# Patient Record
Sex: Female | Born: 1985 | Race: White | Hispanic: No | Marital: Single | Smoking: Former smoker
Health system: Southern US, Community
[De-identification: ages and names within clinical notes are randomized; demographics above are authoritative.]

## PROBLEM LIST (undated history)

## (undated) ENCOUNTER — Inpatient Hospital Stay (HOSPITAL_COMMUNITY): Payer: Self-pay

## (undated) DIAGNOSIS — B999 Unspecified infectious disease: Secondary | ICD-10-CM

## (undated) DIAGNOSIS — O139 Gestational [pregnancy-induced] hypertension without significant proteinuria, unspecified trimester: Secondary | ICD-10-CM

## (undated) DIAGNOSIS — N189 Chronic kidney disease, unspecified: Secondary | ICD-10-CM

## (undated) HISTORY — DX: Unspecified infectious disease: B99.9

## (undated) HISTORY — DX: Chronic kidney disease, unspecified: N18.9

## (undated) HISTORY — PX: WISDOM TOOTH EXTRACTION: SHX21

---
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
## 2004-02-09 ENCOUNTER — Inpatient Hospital Stay (HOSPITAL_COMMUNITY): Admission: AD | Admit: 2004-02-09 | Discharge: 2004-02-10 | Payer: Self-pay | Admitting: Obstetrics and Gynecology

## 2004-03-06 ENCOUNTER — Emergency Department (HOSPITAL_COMMUNITY): Admission: EM | Admit: 2004-03-06 | Discharge: 2004-03-07 | Payer: Self-pay

## 2004-03-11 ENCOUNTER — Emergency Department (HOSPITAL_COMMUNITY): Admission: EM | Admit: 2004-03-11 | Discharge: 2004-03-11 | Payer: Self-pay | Admitting: Family Medicine

## 2004-03-22 ENCOUNTER — Ambulatory Visit: Payer: Self-pay | Admitting: Psychiatry

## 2004-03-22 ENCOUNTER — Inpatient Hospital Stay (HOSPITAL_COMMUNITY): Admission: AD | Admit: 2004-03-22 | Discharge: 2004-03-25 | Payer: Self-pay | Admitting: Psychiatry

## 2004-03-24 ENCOUNTER — Encounter (HOSPITAL_COMMUNITY): Payer: Self-pay | Admitting: Psychiatry

## 2005-02-10 ENCOUNTER — Inpatient Hospital Stay (HOSPITAL_COMMUNITY): Admission: AD | Admit: 2005-02-10 | Discharge: 2005-02-11 | Payer: Self-pay | Admitting: Obstetrics and Gynecology

## 2005-06-10 ENCOUNTER — Ambulatory Visit (HOSPITAL_COMMUNITY): Admission: RE | Admit: 2005-06-10 | Discharge: 2005-06-10 | Payer: Self-pay | Admitting: *Deleted

## 2005-09-28 ENCOUNTER — Inpatient Hospital Stay (HOSPITAL_COMMUNITY): Admission: AD | Admit: 2005-09-28 | Discharge: 2005-10-02 | Payer: Self-pay | Admitting: Obstetrics

## 2005-09-28 ENCOUNTER — Ambulatory Visit: Payer: Self-pay | Admitting: Family Medicine

## 2005-10-03 ENCOUNTER — Inpatient Hospital Stay (HOSPITAL_COMMUNITY): Admission: AD | Admit: 2005-10-03 | Discharge: 2005-10-04 | Payer: Self-pay | Admitting: Obstetrics and Gynecology

## 2005-10-03 ENCOUNTER — Ambulatory Visit: Payer: Self-pay | Admitting: Certified Nurse Midwife

## 2006-03-06 ENCOUNTER — Emergency Department (HOSPITAL_COMMUNITY): Admission: EM | Admit: 2006-03-06 | Discharge: 2006-03-06 | Payer: Self-pay | Admitting: Emergency Medicine

## 2006-12-30 ENCOUNTER — Inpatient Hospital Stay (HOSPITAL_COMMUNITY): Admission: AD | Admit: 2006-12-30 | Discharge: 2006-12-30 | Payer: Self-pay | Admitting: Obstetrics & Gynecology

## 2008-06-10 ENCOUNTER — Emergency Department (HOSPITAL_COMMUNITY): Admission: EM | Admit: 2008-06-10 | Discharge: 2008-06-10 | Payer: Self-pay | Admitting: Family Medicine

## 2008-07-07 ENCOUNTER — Emergency Department (HOSPITAL_COMMUNITY): Admission: EM | Admit: 2008-07-07 | Discharge: 2008-07-07 | Payer: Self-pay | Admitting: Emergency Medicine

## 2008-12-06 ENCOUNTER — Emergency Department (HOSPITAL_COMMUNITY): Admission: EM | Admit: 2008-12-06 | Discharge: 2008-12-06 | Payer: Self-pay | Admitting: Emergency Medicine

## 2009-01-22 ENCOUNTER — Emergency Department (HOSPITAL_COMMUNITY): Admission: EM | Admit: 2009-01-22 | Discharge: 2009-01-22 | Payer: Self-pay | Admitting: Family Medicine

## 2009-02-19 ENCOUNTER — Inpatient Hospital Stay (HOSPITAL_COMMUNITY): Admission: AD | Admit: 2009-02-19 | Discharge: 2009-02-19 | Payer: Self-pay | Admitting: Obstetrics & Gynecology

## 2009-03-21 ENCOUNTER — Emergency Department (HOSPITAL_COMMUNITY): Admission: EM | Admit: 2009-03-21 | Discharge: 2009-03-21 | Payer: Self-pay | Admitting: Emergency Medicine

## 2009-04-26 ENCOUNTER — Inpatient Hospital Stay (HOSPITAL_COMMUNITY): Admission: AD | Admit: 2009-04-26 | Discharge: 2009-04-26 | Payer: Self-pay | Admitting: Obstetrics & Gynecology

## 2009-09-19 ENCOUNTER — Emergency Department (HOSPITAL_COMMUNITY): Admission: EM | Admit: 2009-09-19 | Discharge: 2009-09-20 | Payer: Self-pay | Admitting: Emergency Medicine

## 2009-10-01 ENCOUNTER — Emergency Department (HOSPITAL_COMMUNITY): Admission: EM | Admit: 2009-10-01 | Discharge: 2009-10-01 | Payer: Self-pay | Admitting: Family Medicine

## 2009-11-06 ENCOUNTER — Inpatient Hospital Stay (HOSPITAL_COMMUNITY): Admission: AD | Admit: 2009-11-06 | Discharge: 2009-11-06 | Payer: Self-pay | Admitting: Obstetrics and Gynecology

## 2010-03-23 ENCOUNTER — Emergency Department (HOSPITAL_COMMUNITY): Admission: EM | Admit: 2010-03-23 | Discharge: 2010-03-23 | Payer: Self-pay | Admitting: Family Medicine

## 2010-09-08 LAB — POCT PREGNANCY, URINE: Preg Test, Ur: NEGATIVE

## 2010-09-10 LAB — URINALYSIS, ROUTINE W REFLEX MICROSCOPIC
Glucose, UA: NEGATIVE mg/dL
Ketones, ur: 15 mg/dL — AB
pH: 7.5 (ref 5.0–8.0)

## 2010-09-10 LAB — URINE MICROSCOPIC-ADD ON

## 2010-09-10 LAB — URINE CULTURE

## 2010-09-10 LAB — POCT PREGNANCY, URINE: Preg Test, Ur: NEGATIVE

## 2010-09-10 LAB — POCT RAPID STREP A (OFFICE): Streptococcus, Group A Screen (Direct): POSITIVE — AB

## 2010-09-24 LAB — POCT PREGNANCY, URINE: Preg Test, Ur: NEGATIVE

## 2010-09-27 LAB — POCT PREGNANCY, URINE: Preg Test, Ur: NEGATIVE

## 2010-09-27 LAB — POCT URINALYSIS DIP (DEVICE)
Glucose, UA: NEGATIVE mg/dL
Ketones, ur: NEGATIVE mg/dL
Nitrite: NEGATIVE
Urobilinogen, UA: 0.2 mg/dL (ref 0.0–1.0)

## 2010-09-27 LAB — WET PREP, GENITAL: Trich, Wet Prep: NONE SEEN

## 2010-09-27 LAB — GC/CHLAMYDIA PROBE AMP, GENITAL
Chlamydia, DNA Probe: NEGATIVE
GC Probe Amp, Genital: NEGATIVE
GC Probe Amp, Genital: NEGATIVE

## 2010-09-27 LAB — URINE CULTURE

## 2010-09-29 LAB — POCT URINALYSIS DIP (DEVICE)
Ketones, ur: NEGATIVE mg/dL
Nitrite: NEGATIVE
Specific Gravity, Urine: 1.02 (ref 1.005–1.030)
Urobilinogen, UA: 1 mg/dL (ref 0.0–1.0)

## 2010-09-29 LAB — POCT PREGNANCY, URINE: Preg Test, Ur: NEGATIVE

## 2010-09-29 LAB — GC/CHLAMYDIA PROBE AMP, GENITAL: Chlamydia, DNA Probe: NEGATIVE

## 2010-11-07 NOTE — Discharge Summary (Signed)
NAME:  [name], [name]                 ACCOUNT NO.:  [id number]   MEDICAL RECORD NO.:  [id number]          PATIENT TYPE:  INP   LOCATION:  9112                          FACILITY:  [facility]   PHYSICIAN:  Phil D. Okey Dupre, M.D.     DATE OF BIRTH:  [birth year]   DATE OF ADMISSION:  09/28/2005  DATE OF DISCHARGE:  10/01/2005                                 DISCHARGE SUMMARY   REASON FOR ADMISSION:  Induction of labor secondary to mild preeclampsia.   DISCHARGE DIAGNOSES:  1.  Term vaginal delivery.  2.  Mild preeclampsia  3.  First-degree perineal laceration repair.   HOSPITAL COURSE:  [name] [name] is a 25 year old, G2, P1-0-1-1 who initially  presented at 39+ weeks with mild preeclampsia, systolic blood pressures in  the 140s, diastolic resting 644 who presented to [facility] for vision  changes and had urinalysis that was positive for protein, a creatinine of  0.9, uric acid of 6.7, platelets of 185, AST and ALT 18 and 9 with an LDH of  144.  The patient was placed on magnesium throughout her labor process and  was admitted for induction of labor and was induced using Cytotec.  The  patient was GBS positive and had a penicillin allergy and was given  clindamycin during the delivery.  On September 29, 2005, at 10:01 a.m., this  female delivered a viable female by normal spontaneous vaginal delivery with  Apgar's of 9 and 9 under epidural and had a pudendal block with anesthesia  to repair a first-degree laceration.  Bulb suction was performed at the  perineum.  There were no nuchal cords.  The cord was clamped and cut.  Infant was handed to mom and then to the R.N. team.  Cord blood was sent.  An intact, three-vessel placenta delivered spontaneously at 10:10 a.m.  Cervix and vagina were inspected and repaired as listed above with 3-0  Vicryl.  Estimated blood loss was less than 400 mL.  The patient and the  infant were stable.  Given the patient's history of preeclampsia, she was  transferred to the  intensive care unit to receive magnesium x24 hours after  delivery to prevent seizure activity.  The patient had no seizure activity,  no neurological deficits and her blood pressure prior to discharge was  137/85 with a heart rate of 76.  She did diurese well with the magnesium,  therefore, her lactate Ringer's IV fluid was KVO and magnesium was  discontinued.   DISCHARGE LABORATORY DATA AND X-RAY FINDINGS:  Discharge lab work includes  CBC with hemoglobin of 9.7, white blood cell count of 10, hematocrit of 28,  platelets of 168 with an MCV of 89.4.  Sodium 137, potassium 4, chloride  109, bicarb 26, glucose 98, BUN 10, creatinine 1.  Total bilirubin 0.4, Alk  phos 175, AST 21, ALT 81.  LDH 195.  Uric acid of 6.9.  Magnesium level of  5.8.   DISCHARGE MEDICATIONS:  1.  Ibuprofen 600 mg one tablet every 6 hours as needed for pain.  2.  Prenatal vitamin one tablet  daily while breast-feeding.  3.  Colace 100 mg one tablet p.o. b.i.d. to avoid constipation.  4.  The patient will use Depo-Provera as contraception.   ACTIVITY:  She was instructed to have nothing in the vagina x6 weeks.   FOLLOW UP:  She will follow up at [facility] in 6 weeks.   CONDITION ON DISCHARGE:  She had a female, no circumcision, weight 7 pounds 2  ounces (3250 g), 21 inches.   SPECIAL INSTRUCTIONS:  The patient was instructed to return to the hospital  for fever, high blood pressure, headache or any other concerns.      Alanson Puls, M.D.    ______________________________  Javier Glazier. Okey Dupre, M.D.    MR/MEDQ  D:  10/01/2005  T:  10/02/2005  Job:  [id number]
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
## 2010-11-15 ENCOUNTER — Ambulatory Visit (INDEPENDENT_AMBULATORY_CARE_PROVIDER_SITE_OTHER): Payer: Self-pay

## 2010-11-15 ENCOUNTER — Inpatient Hospital Stay (INDEPENDENT_AMBULATORY_CARE_PROVIDER_SITE_OTHER)
Admission: RE | Admit: 2010-11-15 | Discharge: 2010-11-15 | Disposition: A | Discharge status: Home / Self Care | Payer: Self-pay | Source: Ambulatory Visit | Attending: Family Medicine | Admitting: Family Medicine

## 2010-11-15 DIAGNOSIS — R109 Unspecified abdominal pain: Secondary | ICD-10-CM

## 2010-11-15 LAB — POCT URINALYSIS DIP (DEVICE)
Ketones, ur: NEGATIVE mg/dL
Nitrite: NEGATIVE
Protein, ur: 30 mg/dL — AB
Urobilinogen, UA: 0.2 mg/dL (ref 0.0–1.0)
pH: 5 (ref 5.0–8.0)

## 2010-11-15 LAB — WET PREP, GENITAL: Clue Cells Wet Prep HPF POC: NONE SEEN

## 2010-11-18 LAB — GC/CHLAMYDIA PROBE AMP, GENITAL: Chlamydia, DNA Probe: NEGATIVE

## 2010-11-22 ENCOUNTER — Inpatient Hospital Stay (HOSPITAL_COMMUNITY)
Admission: EM | Admit: 2010-11-22 | Discharge: 2010-11-22 | Disposition: A | Discharge status: Home / Self Care | Payer: Self-pay | Source: Ambulatory Visit | Attending: Obstetrics & Gynecology | Admitting: Obstetrics & Gynecology

## 2010-11-22 DIAGNOSIS — N390 Urinary tract infection, site not specified: Secondary | ICD-10-CM | POA: Insufficient documentation

## 2010-11-22 DIAGNOSIS — R1031 Right lower quadrant pain: Secondary | ICD-10-CM

## 2010-11-22 DIAGNOSIS — R1032 Left lower quadrant pain: Secondary | ICD-10-CM | POA: Insufficient documentation

## 2010-11-22 LAB — POCT PREGNANCY, URINE: Preg Test, Ur: NEGATIVE

## 2010-11-22 LAB — URINALYSIS, ROUTINE W REFLEX MICROSCOPIC
Bilirubin Urine: NEGATIVE
Nitrite: NEGATIVE
Specific Gravity, Urine: >1.03 — ABNORMAL HIGH (ref 1.005–1.030)
Urobilinogen, UA: 0.2 mg/dL (ref 0.0–1.0)

## 2010-11-22 LAB — URINE MICROSCOPIC-ADD ON

## 2010-12-02 ENCOUNTER — Emergency Department (HOSPITAL_COMMUNITY)
Admission: EM | Admit: 2010-12-02 | Discharge: 2010-12-03 | Disposition: A | Discharge status: Home / Self Care | Payer: No Typology Code available for payment source | Attending: Emergency Medicine | Admitting: Emergency Medicine

## 2010-12-02 DIAGNOSIS — M545 Low back pain, unspecified: Secondary | ICD-10-CM | POA: Insufficient documentation

## 2010-12-02 DIAGNOSIS — M542 Cervicalgia: Secondary | ICD-10-CM | POA: Insufficient documentation

## 2010-12-02 DIAGNOSIS — M62838 Other muscle spasm: Secondary | ICD-10-CM | POA: Insufficient documentation

## 2010-12-02 DIAGNOSIS — R51 Headache: Secondary | ICD-10-CM | POA: Insufficient documentation

## 2010-12-03 ENCOUNTER — Encounter (HOSPITAL_COMMUNITY): Payer: Self-pay

## 2010-12-03 ENCOUNTER — Emergency Department (HOSPITAL_COMMUNITY): Payer: No Typology Code available for payment source

## 2010-12-03 ENCOUNTER — Ambulatory Visit (HOSPITAL_COMMUNITY)
Admission: RE | Admit: 2010-12-03 | Discharge: 2010-12-03 | Disposition: A | Discharge status: Home / Self Care | Payer: No Typology Code available for payment source | Source: Ambulatory Visit | Attending: Emergency Medicine | Admitting: Emergency Medicine

## 2011-04-07 LAB — POCT PREGNANCY, URINE
Operator id: 114931
Preg Test, Ur: NEGATIVE

## 2011-04-07 LAB — GC/CHLAMYDIA PROBE AMP, GENITAL: Chlamydia, DNA Probe: NEGATIVE

## 2011-04-07 LAB — WET PREP, GENITAL: Yeast Wet Prep HPF POC: NONE SEEN

## 2011-05-17 ENCOUNTER — Encounter (HOSPITAL_COMMUNITY): Payer: Self-pay | Admitting: *Deleted

## 2011-05-17 ENCOUNTER — Emergency Department (INDEPENDENT_AMBULATORY_CARE_PROVIDER_SITE_OTHER)
Admission: EM | Admit: 2011-05-17 | Discharge: 2011-05-17 | Disposition: A | Discharge status: Home / Self Care | Payer: No Typology Code available for payment source | Source: Home / Self Care | Attending: Family Medicine | Admitting: Family Medicine

## 2011-05-17 DIAGNOSIS — J019 Acute sinusitis, unspecified: Secondary | ICD-10-CM

## 2011-05-17 DIAGNOSIS — H109 Unspecified conjunctivitis: Secondary | ICD-10-CM

## 2011-05-17 DIAGNOSIS — J45909 Unspecified asthma, uncomplicated: Secondary | ICD-10-CM

## 2011-05-17 MED ORDER — ALBUTEROL SULFATE HFA 108 (90 BASE) MCG/ACT IN AERS: 1.0000 | INHALATION_SPRAY | Freq: Four times a day (QID) | RESPIRATORY_TRACT | Status: DC | PRN

## 2011-05-17 MED ORDER — TOBRAMYCIN 0.3 % OP SOLN: 1.0000 [drp] | OPHTHALMIC | Status: AC

## 2011-05-17 MED ORDER — AZITHROMYCIN 250 MG PO TABS: ORAL_TABLET | ORAL | Status: AC

## 2011-05-17 NOTE — ED Notes (Signed)
Reports contracting conjunctivitis from her son; bilat eye redness x 1 wk - had some improvement using honey and tea bags, but irritation and redness continue.  Has been wearing glasses instead of contact lenses.  Also c/o sore throat, cough w/ greenish sputum, and asthma flare-up (ran out of albuterol HFA).

## 2011-05-17 NOTE — ED Provider Notes (Addendum)
History     CSN: [id number] Arrival date & time: 05/17/2011 10:24 AM   First MD Initiated Contact with Patient 05/17/11 [phone]      Chief Complaint  Patient presents with  . Conjunctivitis  . Sore Throat  . Cough    (Consider location/radiation/quality/duration/timing/severity/associated sxs/prior treatment) Patient is a 25 y.o. female presenting with conjunctivitis, pharyngitis, and cough. The history is provided by the patient.  Conjunctivitis  The current episode started 5 to 7 days ago (son with same). The problem has been gradually worsening. The problem is mild. Associated symptoms include congestion, rhinorrhea, sore throat, cough, URI, wheezing, eye discharge and eye redness. Pertinent negatives include no fever, no decreased vision and no double vision. The eye pain is mild. There is pain in the left eye. There were sick contacts at home.  Sore Throat  Cough Associated symptoms include rhinorrhea, sore throat, wheezing and eye redness.    Past Medical History  Diagnosis Date  . Asthma     History reviewed. No pertinent past surgical history.  No family history on file.  History  Substance Use Topics  . Smoking status: Current Everyday Smoker -- 0.5 packs/day  . Smokeless tobacco: Not on file  . Alcohol Use: No    OB History    Grav Para Term Preterm Abortions TAB SAB Ect Mult Living                  Review of Systems  Constitutional: Negative for fever.  HENT: Positive for congestion, sore throat and rhinorrhea.   Eyes: Positive for discharge and redness. Negative for double vision.  Respiratory: Positive for cough and wheezing.     Allergies  Penicillins  Home Medications   Current Outpatient Rx  Name Route Sig Dispense Refill  . ALBUTEROL SULFATE HFA 108 (90 BASE) MCG/ACT IN AERS Inhalation Inhale 1-2 puffs into the lungs every 6 (six) hours as needed for wheezing. 1 Inhaler 1  . ALBUTEROL SULFATE HFA IN Inhalation Inhale into the lungs as  needed.      . AZITHROMYCIN 250 MG PO TABS  Take as directed on pack 6 each 0  . TOBRAMYCIN SULFATE 0.3 % OP SOLN Left Eye Place 1 drop into the left eye every 4 (four) hours. 5 mL 0    BP 117/70  Pulse 75  Temp(Src) 98.2 F (36.8 C) (Oral)  Resp 16  SpO2 100%  LMP 05/06/2011  Physical Exam  Constitutional: She appears well-developed and well-nourished.  HENT:  Head: Normocephalic and atraumatic.  Right Ear: External ear normal.  Left Ear: External ear normal.  Mouth/Throat: Oropharynx is clear and moist. No oropharyngeal exudate.  Eyes: EOM are normal. Pupils are equal, round, and reactive to light. Right eye exhibits no discharge. Left eye exhibits discharge. No foreign body present in the left eye. Right conjunctiva is not injected. Left conjunctiva is injected.  Neck: Normal range of motion. Neck supple.  Cardiovascular: Normal rate, normal heart sounds and intact distal pulses.   Pulmonary/Chest: Effort normal and breath sounds normal.  Abdominal: Soft. Bowel sounds are normal.  Lymphadenopathy:    She has no cervical adenopathy.  Skin: Skin is warm and dry.    ED Course  Procedures (including critical care time)  Labs Reviewed - No data to display No results found.   No diagnosis found.    MDM          Barkley Bruns, MD 05/17/11 1043  Barkley Bruns, MD 05/17/11 1051  Barkley Bruns, MD 05/17/11 [phone]

## 2011-09-28 ENCOUNTER — Encounter (HOSPITAL_COMMUNITY): Payer: Self-pay | Admitting: *Deleted

## 2011-09-28 ENCOUNTER — Emergency Department (HOSPITAL_COMMUNITY)
Admission: EM | Admit: 2011-09-28 | Discharge: 2011-09-28 | Discharge status: Against Medical Advice | Payer: Medicaid Other | Attending: Emergency Medicine | Admitting: Emergency Medicine

## 2011-09-28 ENCOUNTER — Inpatient Hospital Stay (HOSPITAL_COMMUNITY)
Admission: AD | Admit: 2011-09-28 | Discharge: 2011-09-29 | Disposition: A | Discharge status: Hospice | Payer: Medicaid Other | Source: Ambulatory Visit | Attending: Obstetrics and Gynecology | Admitting: Obstetrics and Gynecology

## 2011-09-28 DIAGNOSIS — R1110 Vomiting, unspecified: Secondary | ICD-10-CM | POA: Insufficient documentation

## 2011-09-28 DIAGNOSIS — IMO0002 Reserved for concepts with insufficient information to code with codable children: Secondary | ICD-10-CM

## 2011-09-28 DIAGNOSIS — J029 Acute pharyngitis, unspecified: Secondary | ICD-10-CM | POA: Insufficient documentation

## 2011-09-28 DIAGNOSIS — O98919 Unspecified maternal infectious and parasitic disease complicating pregnancy, unspecified trimester: Secondary | ICD-10-CM

## 2011-09-28 DIAGNOSIS — B999 Unspecified infectious disease: Secondary | ICD-10-CM

## 2011-09-28 DIAGNOSIS — R51 Headache: Secondary | ICD-10-CM | POA: Insufficient documentation

## 2011-09-28 DIAGNOSIS — O99891 Other specified diseases and conditions complicating pregnancy: Secondary | ICD-10-CM | POA: Insufficient documentation

## 2011-09-28 DIAGNOSIS — J020 Streptococcal pharyngitis: Secondary | ICD-10-CM

## 2011-09-28 DIAGNOSIS — O210 Mild hyperemesis gravidarum: Secondary | ICD-10-CM | POA: Insufficient documentation

## 2011-09-28 HISTORY — DX: Gestational (pregnancy-induced) hypertension without significant proteinuria, unspecified trimester: O13.9

## 2011-09-28 NOTE — MAU Note (Signed)
Pt reports she awakened with a sore throat this am, body aches today, vomited x 3 today, denies diarrhea, fever. Pt reports she is 12 weeks preg.

## 2011-09-28 NOTE — MAU Note (Signed)
Pt woke up with a sore throat and chills.  Occasionally vomiting and headache.

## 2011-09-28 NOTE — ED Notes (Signed)
The pt has been ill since this am with aching alll over sorethroat headache vomiting  Poor appetite

## 2011-09-28 NOTE — ED Notes (Signed)
lmp feb  Home preg test pos

## 2011-09-29 LAB — URINALYSIS, ROUTINE W REFLEX MICROSCOPIC
Bilirubin Urine: NEGATIVE
Glucose, UA: NEGATIVE mg/dL
Ketones, ur: NEGATIVE mg/dL
pH: 6 (ref 5.0–8.0)

## 2011-09-29 LAB — COMPREHENSIVE METABOLIC PANEL
Albumin: 3.8 g/dL (ref 3.5–5.2)
BUN: 10 mg/dL (ref 6–23)
Chloride: 99 mEq/L (ref 96–112)
Creatinine, Ser: 0.75 mg/dL (ref 0.50–1.10)
GFR calc Af Amer: >90 mL/min (ref >90)
Glucose, Bld: 121 mg/dL — ABNORMAL HIGH (ref 70–99)
Total Bilirubin: 0.3 mg/dL (ref 0.3–1.2)
Total Protein: 6.2 g/dL (ref 6.0–8.3)

## 2011-09-29 LAB — DIFFERENTIAL
Basophils Relative: 0 % (ref 0–1)
Eosinophils Absolute: 0 10*3/uL (ref 0.0–0.7)
Monocytes Absolute: 0.3 10*3/uL (ref 0.1–1.0)
Monocytes Relative: 4 % (ref 3–12)

## 2011-09-29 LAB — URINE MICROSCOPIC-ADD ON

## 2011-09-29 LAB — CBC
HCT: 38.1 % (ref 36.0–46.0)
Hemoglobin: 13 g/dL (ref 12.0–15.0)
MCH: 31.2 pg (ref 26.0–34.0)
MCHC: 34.1 g/dL (ref 30.0–36.0)

## 2011-09-29 LAB — RAPID STREP SCREEN (MED CTR MEBANE ONLY): Streptococcus, Group A Screen (Direct): POSITIVE — AB

## 2011-09-29 MED ORDER — ONDANSETRON HCL 4 MG/2ML IJ SOLN
4.0000 mg | Freq: Once | INTRAMUSCULAR | Status: AC
  Administered 2011-09-29: 4 mg via INTRAVENOUS
  Filled 2011-09-29: qty 2

## 2011-09-29 MED ORDER — AZITHROMYCIN 250 MG PO TABS
500.0000 mg | ORAL_TABLET | Freq: Once | ORAL | Status: AC
  Administered 2011-09-29: 500 mg via ORAL
  Filled 2011-09-29: qty 2

## 2011-09-29 MED ORDER — AZITHROMYCIN 250 MG PO TABS: ORAL_TABLET | ORAL | Status: AC

## 2011-09-29 MED ORDER — ACETAMINOPHEN 500 MG PO TABS
1000.0000 mg | ORAL_TABLET | Freq: Once | ORAL | Status: AC
  Administered 2011-09-29: 1000 mg via ORAL
  Filled 2011-09-29: qty 2

## 2011-09-29 MED ORDER — LACTATED RINGERS IV BOLUS (SEPSIS)
1000.0000 mL | Freq: Once | INTRAVENOUS | Status: AC
  Administered 2011-09-29: 1000 mL via INTRAVENOUS

## 2011-09-29 MED ORDER — PROMETHAZINE HCL 25 MG PO TABS: 25.0000 mg | ORAL_TABLET | Freq: Four times a day (QID) | ORAL | Status: DC | PRN

## 2011-09-29 NOTE — MAU Provider Note (Signed)
History     CSN: [id number]  Arrival date & time 09/28/11  2244   None     Chief Complaint  Patient presents with  . Generalized Body Aches  . Nausea    HPI [name] [name] is a 26 y.o. female @ [redacted]w[redacted]d gestation who presents to MAU for sore throat, fever, swollen glands and aching that started this morning. Has had nausea and vomiting that started tonight. Lower abdominal pain started tonight. Denies diarrhea. The abdominal pain feels like when had a kidney infection in the past. The history was provided by the patient.   Past Medical History  Diagnosis Date  . Asthma   . Pregnancy induced hypertension     Past Surgical History  Procedure Date  . Wisdom tooth extraction     History reviewed. No pertinent family history.  History  Substance Use Topics  . Smoking status: Current Everyday Smoker -- 0.5 packs/day  . Smokeless tobacco: Not on file  . Alcohol Use: No    OB History    Grav Para Term Preterm Abortions TAB SAB Ect Mult Living   2 1 1       1       Review of Systems  Constitutional: Positive for fever and chills. Negative for diaphoresis and fatigue.  HENT: Positive for sore throat. Negative for ear pain, congestion, facial swelling, trouble swallowing, neck pain, neck stiffness, dental problem and sinus pressure.   Eyes: Negative for photophobia, pain and discharge.  Respiratory: Negative for cough, chest tightness and wheezing.   Gastrointestinal: Positive for nausea, vomiting and abdominal pain. Negative for diarrhea, constipation and abdominal distention.  Genitourinary: Positive for pelvic pain. Negative for dysuria, frequency, flank pain, vaginal bleeding, vaginal discharge and difficulty urinating.  Musculoskeletal: Negative for myalgias, back pain and gait problem.  Skin: Negative for color change and rash.  Neurological: Positive for dizziness and headaches. Negative for speech difficulty, weakness, light-headedness and numbness.  Psychiatric/Behavioral:  Negative for confusion and agitation.    Allergies  Penicillins  Home Medications  No current outpatient prescriptions on file.  BP 115/58  Pulse 104  Temp(Src) 104 F (40 C) (Oral)  Resp 20  Ht 5\' 6"  (1.676 m)  Wt 167 lb (75.751 kg)  BMI 26.95 kg/m2  LMP 07/29/2011  Physical Exam  Nursing note and vitals reviewed. Constitutional: She is oriented to person, place, and time. She appears well-developed and well-nourished.  HENT:  Head: Normocephalic.  Mouth/Throat: Uvula is midline. Posterior oropharyngeal erythema present.       Tonsils enlarged with erythema.  Eyes: EOM are normal.  Neck: Neck supple.  Cardiovascular:       tachycardia  Pulmonary/Chest: Effort normal.  Abdominal: Soft. There is tenderness in the suprapubic area.       Mild left CVA tenderness.  Musculoskeletal: Normal range of motion.  Lymphadenopathy:    She has cervical adenopathy.  Neurological: She is alert and oriented to person, place, and time. No cranial nerve deficit.  Skin: Skin is warm and dry.  Psychiatric: She has a normal mood and affect. Her behavior is normal. Judgment and thought content normal.   Results for orders placed during the hospital encounter of 09/28/11 (from the past 24 hour(s))  CBC     Status: Abnormal   Collection Time   09/29/11 12:40 AM      Component Value Range   WBC 8.1  4.0 - 10.5 (K/uL)   RBC 4.17  3.87 - 5.11 (MIL/uL)   Hemoglobin  13.0  12.0 - 15.0 (g/dL)   HCT 40.9  81.1 - 91.4 (%)   MCV 91.4  78.0 - 100.0 (fL)   MCH 31.2  26.0 - 34.0 (pg)   MCHC 34.1  30.0 - 36.0 (g/dL)   RDW 78.2  95.6 - 21.3 (%)   Platelets 126 (*) 150 - 400 (K/uL)  DIFFERENTIAL     Status: Abnormal   Collection Time   09/29/11 12:40 AM      Component Value Range   Neutrophils Relative 89 (*) 43 - 77 (%)   Neutro Abs 7.3  1.7 - 7.7 (K/uL)   Lymphocytes Relative 7 (*) 12 - 46 (%)   Lymphs Abs 0.5 (*) 0.7 - 4.0 (K/uL)   Monocytes Relative 4  3 - 12 (%)   Monocytes Absolute 0.3  0.1 -  1.0 (K/uL)   Eosinophils Relative 0  0 - 5 (%)   Eosinophils Absolute 0.0  0.0 - 0.7 (K/uL)   Basophils Relative 0  0 - 1 (%)   Basophils Absolute 0.0  0.0 - 0.1 (K/uL)  COMPREHENSIVE METABOLIC PANEL     Status: Abnormal   Collection Time   09/29/11 12:40 AM      Component Value Range   Sodium 133 (*) 135 - 145 (mEq/L)   Potassium 3.4 (*) 3.5 - 5.1 (mEq/L)   Chloride 99  96 - 112 (mEq/L)   CO2 24  19 - 32 (mEq/L)   Glucose, Bld 121 (*) 70 - 99 (mg/dL)   BUN 10  6 - 23 (mg/dL)   Creatinine, Ser 0.86  0.50 - 1.10 (mg/dL)   Calcium 8.7  8.4 - 57.8 (mg/dL)   Total Protein 6.2  6.0 - 8.3 (g/dL)   Albumin 3.8  3.5 - 5.2 (g/dL)   AST 14  0 - 37 (U/L)   ALT 21  0 - 35 (U/L)   Alkaline Phosphatase 62  39 - 117 (U/L)   Total Bilirubin 0.3  0.3 - 1.2 (mg/dL)   GFR calc non Af Amer >90  >90 (mL/min)   GFR calc Af Amer >90  >90 (mL/min)  RAPID STREP SCREEN     Status: Abnormal   Collection Time   09/29/11 12:40 AM      Component Value Range   Streptococcus, Group A Screen (Direct) POSITIVE (*) NEGATIVE   URINALYSIS, ROUTINE W REFLEX MICROSCOPIC     Status: Abnormal   Collection Time   09/29/11 12:50 AM      Component Value Range   Color, Urine YELLOW  YELLOW    APPearance CLEAR  CLEAR    Specific Gravity, Urine 1.015  1.005 - 1.030    pH 6.0  5.0 - 8.0    Glucose, UA NEGATIVE  NEGATIVE (mg/dL)   Hgb urine dipstick SMALL (*) NEGATIVE    Bilirubin Urine NEGATIVE  NEGATIVE    Ketones, ur NEGATIVE  NEGATIVE (mg/dL)   Protein, ur NEGATIVE  NEGATIVE (mg/dL)   Urobilinogen, UA 0.2  0.0 - 1.0 (mg/dL)   Nitrite NEGATIVE  NEGATIVE    Leukocytes, UA NEGATIVE  NEGATIVE   URINE MICROSCOPIC-ADD ON     Status: Abnormal   Collection Time   09/29/11 12:50 AM      Component Value Range   Squamous Epithelial / LPF FEW (*) RARE    WBC, UA 0-2  <3 (WBC/hpf)   RBC / HPF 0-2  <3 (RBC/hpf)   Assessment: Strep pharyngitis   Nausea in pregnancy  Plan:  Since patient is allergic to penicillin will treat  with z-pak   Phenergan Rx   Start prenatal care   Return as needed.  ED Course  Procedures  MDM

## 2011-09-29 NOTE — Discharge Instructions (Signed)
Strep Infections Streptococcal (strep) infections are caused by streptococcal germs (bacteria). Strep infections are very contagious. Strep infections can occur in:  Ears.   The nose.   The throat.   Sinuses.   Skin.   Blood.   Lungs.   Spinal fluid.   Urine.  Strep throat is the most common bacterial infection in children. The symptoms of a Strep infection usually get better in 2 to 3 days after starting medicine that kills germs (antibiotics). Strep is usually not contagious after 36 to 48 hours of antibiotic treatment. Strep infections that are not treated can cause serious complications. These include gland infections, throat abscess, rheumatic fever and kidney disease. DIAGNOSIS  The diagnosis of strep is made by:  A culture for the strep germ.  TREATMENT  These infections require oral antibiotics for a full 10 days, an antibiotic shot or antibiotics given into the vein (intravenous, IV). HOME CARE INSTRUCTIONS   Be sure to finish all antibiotics even if feeling better.   Only take over-the-counter medicines for pain, discomfort and or fever, as directed by your caregiver.   Close contacts that have a fever, sore throat or illness symptoms should see their caregiver right away.   You or your child may return to work, school or daycare if the fever and pain are better in 2 to 3 days after starting antibiotics.  SEEK MEDICAL CARE IF:   You or your child has an oral temperature above 102 F (38.9 C).   Your baby is older than 3 months with a rectal temperature of 100.5 F (38.1 C) or higher for more than 1 day.   You or your child is not better in 3 days.  SEEK IMMEDIATE MEDICAL CARE IF:   You or your child has an oral temperature above 102 F (38.9 C), not controlled by medicine.   Your baby is older than 3 months with a rectal temperature of 102 F (38.9 C) or higher.   Your baby is 51 months old or younger with a rectal temperature of 100.4 F (38 C) or  higher.   There is a spreading rash.   There is difficulty swallowing or breathing.   There is increased pain or swelling.  Document Released: 07/16/2004 Document Revised: 05/28/2011 Document Reviewed: 04/24/2009 [facility] Patient Information 2012 [place], [place].Strep Throat Strep throat is an infection of the throat caused by a bacteria named Streptococcus pyogenes. Your caregiver may call the infection streptococcal "tonsillitis" or "pharyngitis" depending on whether there are signs of inflammation in the tonsils or back of the throat. Strep throat is most common in children from 17 to 21 years old during the cold months of the year, but it can occur in people of any age during any season. This infection is spread from person to person (contagious) through coughing, sneezing, or other close contact. SYMPTOMS   Fever or chills.   Painful, swollen, red tonsils or throat.   Pain or difficulty when swallowing.   White or yellow spots on the tonsils or throat.   Swollen, tender lymph nodes or "glands" of the neck or under the jaw.   Red rash all over the body (rare).  DIAGNOSIS  Many different infections can cause the same symptoms. A test must be done to confirm the diagnosis so the right treatment can be given. A "rapid strep test" can help your caregiver make the diagnosis in a few minutes. If this test is not available, a light swab of the infected area can  be used for a throat culture test. If a throat culture test is done, results are usually available in a day or two. TREATMENT  Strep throat is treated with antibiotic medicine. HOME CARE INSTRUCTIONS   Gargle with 1 tsp of salt in 1 cup of warm water, 3 to 4 times per day or as needed for comfort.   Family members who also have a sore throat or fever should be tested for strep throat and treated with antibiotics if they have the strep infection.   Make sure everyone in your household washes their hands well.   Do not share food,  drinking cups, or personal items that could cause the infection to spread to others.   You may need to eat a soft food diet until your sore throat gets better.   Drink enough water and fluids to keep your urine clear or pale yellow. This will help prevent dehydration.   Get plenty of rest.   Stay home from school, daycare, or work until you have been on antibiotics for 24 hours.   Only take over-the-counter or prescription medicines for pain, discomfort, or fever as directed by your caregiver.   If antibiotics are prescribed, take them as directed. Finish them even if you start to feel better.  SEEK MEDICAL CARE IF:   The glands in your neck continue to enlarge.   You develop a rash, cough, or earache.   You cough up green, yellow-brown, or bloody sputum.   You have pain or discomfort not controlled by medicines.   Your problems seem to be getting worse rather than better.  SEEK IMMEDIATE MEDICAL CARE IF:   You develop any new symptoms such as vomiting, severe headache, stiff or painful neck, chest pain, shortness of breath, or trouble swallowing.   You develop severe throat pain, drooling, or changes in your voice.   You develop swelling of the neck, or the skin on the neck becomes red and tender.   You have a fever.   You develop signs of dehydration, such as fatigue, dry mouth, and decreased urination.   You become increasingly sleepy, or you cannot wake up completely.  Document Released: 06/05/2000 Document Revised: 05/28/2011 Document Reviewed: 08/07/2010 [facility] Patient Information 2012 [place], [place].Strep Throat Strep throat is an infection of the throat caused by a bacteria named Streptococcus pyogenes. Your caregiver may call the infection streptococcal "tonsillitis" or "pharyngitis" depending on whether there are signs of inflammation in the tonsils or back of the throat. Strep throat is most common in children from 74 to 38 years old during the cold months of the  year, but it can occur in people of any age during any season. This infection is spread from person to person (contagious) through coughing, sneezing, or other close contact. SYMPTOMS   Fever or chills.   Painful, swollen, red tonsils or throat.   Pain or difficulty when swallowing.   White or yellow spots on the tonsils or throat.   Swollen, tender lymph nodes or "glands" of the neck or under the jaw.   Red rash all over the body (rare).  DIAGNOSIS  Many different infections can cause the same symptoms. A test must be done to confirm the diagnosis so the right treatment can be given. A "rapid strep test" can help your caregiver make the diagnosis in a few minutes. If this test is not available, a light swab of the infected area can be used for a throat culture test. If a throat culture  test is done, results are usually available in a day or two. TREATMENT  Strep throat is treated with antibiotic medicine. HOME CARE INSTRUCTIONS   Gargle with 1 tsp of salt in 1 cup of warm water, 3 to 4 times per day or as needed for comfort.   Family members who also have a sore throat or fever should be tested for strep throat and treated with antibiotics if they have the strep infection.   Make sure everyone in your household washes their hands well.   Do not share food, drinking cups, or personal items that could cause the infection to spread to others.   You may need to eat a soft food diet until your sore throat gets better.   Drink enough water and fluids to keep your urine clear or pale yellow. This will help prevent dehydration.   Get plenty of rest.   Stay home from school, daycare, or work until you have been on antibiotics for 24 hours.   Only take over-the-counter or prescription medicines for pain, discomfort, or fever as directed by your caregiver.   If antibiotics are prescribed, take them as directed. Finish them even if you start to feel better.  SEEK MEDICAL CARE IF:   The  glands in your neck continue to enlarge.   You develop a rash, cough, or earache.   You cough up green, yellow-brown, or bloody sputum.   You have pain or discomfort not controlled by medicines.   Your problems seem to be getting worse rather than better.  SEEK IMMEDIATE MEDICAL CARE IF:   You develop any new symptoms such as vomiting, severe headache, stiff or painful neck, chest pain, shortness of breath, or trouble swallowing.   You develop severe throat pain, drooling, or changes in your voice.   You develop swelling of the neck, or the skin on the neck becomes red and tender.   You have a fever.   You develop signs of dehydration, such as fatigue, dry mouth, and decreased urination.   You become increasingly sleepy, or you cannot wake up completely.  Document Released: 06/05/2000 Document Revised: 05/28/2011 Document Reviewed: 08/07/2010 [facility] Patient Information 2012 [place], [place].

## 2011-09-29 NOTE — MAU Provider Note (Signed)
Agree with above note.  [name] [name] 09/29/2011 7:22 AM

## 2011-10-09 ENCOUNTER — Other Ambulatory Visit: Payer: Self-pay | Admitting: Family Medicine

## 2011-10-09 DIAGNOSIS — N6324 Unspecified lump in the left breast, lower inner quadrant: Secondary | ICD-10-CM

## 2011-10-19 ENCOUNTER — Other Ambulatory Visit: Payer: Self-pay

## 2011-10-19 ENCOUNTER — Ambulatory Visit
Admission: RE | Admit: 2011-10-19 | Discharge: 2011-10-19 | Disposition: A | Discharge status: Home / Self Care | Payer: Medicaid Other | Source: Ambulatory Visit | Attending: Family Medicine | Admitting: Family Medicine

## 2011-10-19 ENCOUNTER — Other Ambulatory Visit: Payer: Self-pay | Admitting: Family Medicine

## 2011-10-19 DIAGNOSIS — N6324 Unspecified lump in the left breast, lower inner quadrant: Secondary | ICD-10-CM

## 2011-11-27 ENCOUNTER — Inpatient Hospital Stay (HOSPITAL_COMMUNITY)
Admission: AD | Admit: 2011-11-27 | Discharge: 2011-11-27 | Disposition: A | Discharge status: Home / Self Care | Payer: Medicaid Other | Source: Ambulatory Visit | Attending: Obstetrics & Gynecology | Admitting: Obstetrics & Gynecology

## 2011-11-27 ENCOUNTER — Encounter (HOSPITAL_COMMUNITY): Payer: Self-pay | Admitting: *Deleted

## 2011-11-27 DIAGNOSIS — O2390 Unspecified genitourinary tract infection in pregnancy, unspecified trimester: Secondary | ICD-10-CM

## 2011-11-27 DIAGNOSIS — N390 Urinary tract infection, site not specified: Secondary | ICD-10-CM

## 2011-11-27 DIAGNOSIS — O219 Vomiting of pregnancy, unspecified: Secondary | ICD-10-CM

## 2011-11-27 DIAGNOSIS — O210 Mild hyperemesis gravidarum: Secondary | ICD-10-CM | POA: Insufficient documentation

## 2011-11-27 DIAGNOSIS — O2340 Unspecified infection of urinary tract in pregnancy, unspecified trimester: Secondary | ICD-10-CM

## 2011-11-27 LAB — URINALYSIS, ROUTINE W REFLEX MICROSCOPIC
Bilirubin Urine: NEGATIVE
Glucose, UA: NEGATIVE mg/dL
Hgb urine dipstick: NEGATIVE
Ketones, ur: NEGATIVE mg/dL
Specific Gravity, Urine: 1.025 (ref 1.005–1.030)
pH: 6 (ref 5.0–8.0)

## 2011-11-27 LAB — URINE MICROSCOPIC-ADD ON

## 2011-11-27 MED ORDER — NITROFURANTOIN MONOHYD MACRO 100 MG PO CAPS: 100.0000 mg | ORAL_CAPSULE | Freq: Two times a day (BID) | ORAL | Status: AC

## 2011-11-27 MED ORDER — ONDANSETRON 8 MG PO TBDP
8.0000 mg | ORAL_TABLET | Freq: Once | ORAL | Status: AC
  Administered 2011-11-27: 8 mg via ORAL
  Filled 2011-11-27: qty 1

## 2011-11-27 MED ORDER — PROMETHAZINE HCL 25 MG PO TABS: 25.0000 mg | ORAL_TABLET | Freq: Four times a day (QID) | ORAL | Status: DC | PRN

## 2011-11-27 MED ORDER — ONDANSETRON 8 MG PO TBDP: 8.0000 mg | ORAL_TABLET | Freq: Three times a day (TID) | ORAL | Status: AC | PRN

## 2011-11-27 NOTE — MAU Provider Note (Signed)
[name] [name] y.o.[id number] @[redacted]w[redacted]d  by LMP Chief Complaint  Patient presents with  . Morning Sickness     First Provider Initiated Contact with Patient 11/27/11 2335      SUBJECTIVE  HPI: Pt reports frequent N/V, being unable to keep much down for the past week and dysuria. She has not tried any treatments. She has not started prenatal care, but plans to go to [facility]. He denies VF, LOF, hematuria, flank pain, fever, chills or abd pain.   Past Medical History  Diagnosis Date  . Asthma   . Pregnancy induced hypertension    Past Surgical History  Procedure Date  . Wisdom tooth extraction    History   Social History  . Marital Status: Single    Spouse Name: N/A    Number of Children: N/A  . Years of Education: N/A   Occupational History  . Not on file.   Social History Main Topics  . Smoking status: Former Smoker -- 0.5 packs/day  . Smokeless tobacco: Not on file  . Alcohol Use: No  . Drug Use: No  . Sexually Active: Yes   Other Topics Concern  . Not on file   Social History Narrative  . No narrative on file   No current facility-administered medications on file prior to encounter.   Current Outpatient Prescriptions on File Prior to Encounter  Medication Sig Dispense Refill  . Prenatal Vit-Fe Fumarate-FA (PRENATAL PO) Take 1 tablet by mouth daily.      Marland Kitchen albuterol (PROVENTIL HFA;VENTOLIN HFA) 108 (90 BASE) MCG/ACT inhaler Inhale 1-2 puffs into the lungs every 6 (six) hours as needed for wheezing.  1 Inhaler  1  . promethazine (PHENERGAN) 25 MG tablet Take 1 tablet (25 mg total) by mouth every 6 (six) hours as needed for nausea.  15 tablet  0   Allergies  Allergen Reactions  . Penicillins     Reaction unknown    ROS: Pertinent items in HPI  OBJECTIVE Blood pressure 124/68, pulse 91, temperature 98.6 F (37 C), temperature source Oral, resp. rate 18, height 5' 6.25" (1.683 m), weight 75.524 kg (166 lb 8 oz), last menstrual period 09/24/2011.  GENERAL:  Well-developed, well-nourished female in no acute distress.  HEENT: Normocephalic, good dentition HEART: normal rate RESP: normal effort ABDOMEN: Soft, nontender EXTREMITIES: Nontender, no edema NEURO: Alert and oriented SPECULUM EXAM: deferred FHR 160 by informal BS [place]. CRL 9.3 weeks. Active baby.  LAB RESULTS Results for orders placed during the hospital encounter of 11/27/11 (from the past 24 hour(s))  URINALYSIS, ROUTINE W REFLEX MICROSCOPIC     Status: Abnormal   Collection Time   11/27/11  8:50 PM      Component Value Range   Color, Urine YELLOW  YELLOW    APPearance CLEAR  CLEAR    Specific Gravity, Urine 1.025  1.005 - 1.030    pH 6.0  5.0 - 8.0    Glucose, UA NEGATIVE  NEGATIVE (mg/dL)   Hgb urine dipstick NEGATIVE  NEGATIVE    Bilirubin Urine NEGATIVE  NEGATIVE    Ketones, ur NEGATIVE  NEGATIVE (mg/dL)   Protein, ur NEGATIVE  NEGATIVE (mg/dL)   Urobilinogen, UA 0.2  0.0 - 1.0 (mg/dL)   Nitrite NEGATIVE  NEGATIVE    Leukocytes, UA MODERATE (*) NEGATIVE   URINE MICROSCOPIC-ADD ON     Status: Abnormal   Collection Time   11/27/11  8:50 PM      Component Value Range   Squamous Epithelial / LPF  MANY (*) RARE    WBC, UA 7-10  <3 (WBC/hpf)   Bacteria, UA FEW (*) RARE    Casts HYALINE CASTS (*) NEGATIVE    Urine-Other MUCOUS PRESENT     Pt requests D/C prior to trial of POs.  IMAGING   ASSESSMENT 1. UTI (urinary tract infection) during pregnancy   2. Nausea/vomiting in pregnancy    PLAN D/C home Follow-up Information    Please follow up. (start prenatal care or MAU as needed if symptoms worsen)         Medication List  As of 11/27/2011 11:52 PM   START taking these medications         ondansetron 8 MG disintegrating tablet   Commonly known as: ZOFRAN-ODT   Take 1 tablet (8 mg total) by mouth every 8 (eight) hours as needed for nausea.         CONTINUE taking these medications         albuterol 108 (90 BASE) MCG/ACT inhaler   Commonly known as: PROVENTIL  HFA;VENTOLIN HFA   Inhale 1-2 puffs into the lungs every 6 (six) hours as needed for wheezing.      PRENATAL PO      * promethazine 25 MG tablet   Commonly known as: PHENERGAN      * promethazine 25 MG tablet   Commonly known as: PHENERGAN   Take 1 tablet (25 mg total) by mouth every 6 (six) hours as needed for nausea.     * Notice: This list has 2 medication(s) that are the same as other medications prescribed for you. Read the directions carefully, and ask your doctor or other care provider to review them with you.        Where to get your medications    These are the prescriptions that you need to pick up.   You may get these medications from any pharmacy.         ondansetron 8 MG disintegrating tablet   promethazine 25 MG tablet          Bland diet Hyperemesis precautions  Dorathy Kinsman 11/27/2011 11:36 PM

## 2011-11-27 NOTE — MAU Note (Signed)
Pt states, " " I've thrown up about seven times today, and I can't keep anything down."

## 2011-11-28 MED ORDER — PROMETHAZINE HCL 25 MG RE SUPP: 25.0000 mg | Freq: Four times a day (QID) | RECTAL | Status: DC | PRN

## 2011-11-30 ENCOUNTER — Ambulatory Visit (INDEPENDENT_AMBULATORY_CARE_PROVIDER_SITE_OTHER): Payer: Medicaid Other | Admitting: Obstetrics and Gynecology

## 2011-11-30 DIAGNOSIS — Z331 Pregnant state, incidental: Secondary | ICD-10-CM

## 2011-12-01 ENCOUNTER — Other Ambulatory Visit: Payer: Self-pay

## 2011-12-01 DIAGNOSIS — Z331 Pregnant state, incidental: Secondary | ICD-10-CM

## 2011-12-01 LAB — PRENATAL PANEL VII
Antibody Screen: NEGATIVE
Basophils Absolute: 0 10*3/uL (ref 0.0–0.1)
Eosinophils Relative: 1 % (ref 0–5)
Hepatitis B Surface Ag: NEGATIVE
Lymphocytes Relative: 33 % (ref 12–46)
Lymphs Abs: 1.8 10*3/uL (ref 0.7–4.0)
Neutrophils Relative %: 58 % (ref 43–77)
Platelets: 164 10*3/uL (ref 150–400)
RBC: 4.2 MIL/uL (ref 3.87–5.11)
RDW: 13.4 % (ref 11.5–15.5)
Rubella: 44.5 IU/mL — ABNORMAL HIGH
WBC: 5.5 10*3/uL (ref 4.0–10.5)

## 2011-12-03 ENCOUNTER — Other Ambulatory Visit: Payer: Self-pay | Admitting: Obstetrics and Gynecology

## 2011-12-03 DIAGNOSIS — Z36 Encounter for antenatal screening of mother: Secondary | ICD-10-CM

## 2011-12-28 ENCOUNTER — Encounter: Payer: Medicaid Other | Admitting: Obstetrics and Gynecology

## 2011-12-28 ENCOUNTER — Ambulatory Visit: Payer: Medicaid Other

## 2012-01-06 ENCOUNTER — Ambulatory Visit (INDEPENDENT_AMBULATORY_CARE_PROVIDER_SITE_OTHER): Payer: Medicaid Other | Admitting: Obstetrics and Gynecology

## 2012-01-06 ENCOUNTER — Encounter (HOSPITAL_COMMUNITY): Payer: Self-pay | Admitting: *Deleted

## 2012-01-06 ENCOUNTER — Inpatient Hospital Stay (HOSPITAL_COMMUNITY)
Admission: AD | Admit: 2012-01-06 | Discharge: 2012-01-06 | Disposition: A | Discharge status: Home / Self Care | Payer: Medicaid Other | Source: Ambulatory Visit | Attending: Family Medicine | Admitting: Family Medicine

## 2012-01-06 ENCOUNTER — Encounter: Payer: Self-pay | Admitting: Obstetrics and Gynecology

## 2012-01-06 VITALS — BP 120/60 | Wt 172.0 lb

## 2012-01-06 DIAGNOSIS — W19XXXA Unspecified fall, initial encounter: Secondary | ICD-10-CM

## 2012-01-06 DIAGNOSIS — IMO0002 Reserved for concepts with insufficient information to code with codable children: Secondary | ICD-10-CM

## 2012-01-06 DIAGNOSIS — O26849 Uterine size-date discrepancy, unspecified trimester: Secondary | ICD-10-CM

## 2012-01-06 DIAGNOSIS — O0930 Supervision of pregnancy with insufficient antenatal care, unspecified trimester: Secondary | ICD-10-CM | POA: Insufficient documentation

## 2012-01-06 DIAGNOSIS — F319 Bipolar disorder, unspecified: Secondary | ICD-10-CM | POA: Clinically undetermined

## 2012-01-06 DIAGNOSIS — Z1379 Encounter for other screening for genetic and chromosomal anomalies: Secondary | ICD-10-CM

## 2012-01-06 DIAGNOSIS — O99891 Other specified diseases and conditions complicating pregnancy: Secondary | ICD-10-CM | POA: Insufficient documentation

## 2012-01-06 DIAGNOSIS — Z711 Person with feared health complaint in whom no diagnosis is made: Secondary | ICD-10-CM

## 2012-01-06 DIAGNOSIS — O1403 Mild to moderate pre-eclampsia, third trimester: Secondary | ICD-10-CM

## 2012-01-06 DIAGNOSIS — Z331 Pregnant state, incidental: Secondary | ICD-10-CM

## 2012-01-06 DIAGNOSIS — F1911 Other psychoactive substance abuse, in remission: Secondary | ICD-10-CM | POA: Clinically undetermined

## 2012-01-06 DIAGNOSIS — W108XXA Fall (on) (from) other stairs and steps, initial encounter: Secondary | ICD-10-CM | POA: Insufficient documentation

## 2012-01-06 LAB — POCT WET PREP (WET MOUNT): Bacteria Wet Prep HPF POC: NEGATIVE

## 2012-01-06 LAB — URINALYSIS, ROUTINE W REFLEX MICROSCOPIC
Nitrite: NEGATIVE
Specific Gravity, Urine: 1.025 (ref 1.005–1.030)
Urobilinogen, UA: 0.2 mg/dL (ref 0.0–1.0)
pH: 6 (ref 5.0–8.0)

## 2012-01-06 LAB — RAPID URINE DRUG SCREEN, HOSP PERFORMED: Opiates: NOT DETECTED

## 2012-01-06 NOTE — MAU Note (Signed)
Patient states she fell down carpeted stairs on her right hip this am. Has not started prenatal care yet but plans to go to [facility]. Has been having mid to upper abdominal pain. Denies any bleeding or discharge.

## 2012-01-06 NOTE — Progress Notes (Signed)
Quad screen today [facility]

## 2012-01-06 NOTE — Progress Notes (Signed)
Patient ID: AIYANNAH FAYAD, female   DOB: [birth year], 26 y.o.   MRN: [id number] [redacted]w[redacted]d  [name] [name] is here for a new obstetrical visit.  Some N,V unable to fill meds with insurance issues [redacted]w[redacted]d @IPILAPH @ OB History    Grav Para Term Preterm Abortions TAB SAB Ect Mult Living   3 1 1  1  1   1     term pg PIH 39 week induction Past Medical History  Diagnosis Date  . Pregnancy induced hypertension   . Asthma     ALLERGIES;IF GETS UPSET; TRIGGERS ASTHMA  . Infection     YEAST INF;NOT FREQ  . Infection     UTI;CURRENTLY TAKING MACROBID FOR TX OF UTI  . Chronic kidney disease     KIDNEY INF;WAS HOSPITALIZED X 3 DAYS   Past Surgical History  Procedure Date  . Wisdom tooth extraction    Family History: family history includes Alcohol abuse in her father; Asthma in her brother, mother, and sister; Diabetes in her father and sister; GER disease in her mother; Hypertension in her father; Kidney disease in her maternal grandmother; and Seizures in her brother and cousin.  There is no history of Anesthesia problems. Social History:  reports that she quit smoking about 3 months ago. Her smoking use included Cigarettes. She has a .8 pack-year smoking history. She has never used smokeless tobacco. She reports that she does not drink alcohol or use illicit drugs.    Blood pressure 120/60, weight 172 lb (78.019 kg), last menstrual period 09/24/2011.  Physical exam: Calm, no distress Alert, appropriate appearance for age. No acute distress HEENT Grossly normal Thyroid no masses, nodes or enlargement  lungs clear bilaterally, AP RRR, breasts bilaterally no masses, dimpling, drainage, abd soft, gravid, nt, bowel sounds active no edema to lower extremities EGBUS and perineum wnl, normal hair distribution Vagina pink moist Cervix LTC no cerical motion tenderness Scant whige discharge appears adequate Fundal height 11 week FHTS 150  Prenatal labs: ABO, Rh: O/POS/-- (06/10 1556) Antibody: NEG  (06/10 1556) Rubella:   RPR: NON REAC (06/10 1556)  HBsAg: NEGATIVE (06/10 1556)  HIV: NON REACTIVE (06/10 1556)  GBS:     Assessment/Plan: [redacted]w[redacted]d S<D Gc/chl done Wet prep: neg trich, hyphae, clue neg [facility]: [facility] requested records. [place]: [place] dating Genetic screen: plans QUAD next visit if dating appropriate  Collaboration with Dr Normand Sloop. [facility], Kimberli Winne 01/06/2012, 1:38 PM Lavera Guise, CNM

## 2012-01-06 NOTE — MAU Provider Note (Signed)
History     CSN: [id number]  Arrival date and time: 01/06/12 1125   First Provider Initiated Contact with Patient 01/06/12 1317      Chief Complaint  Patient presents with  . Fall   HPI This is a 26 y.o. female at [redacted]w[redacted]d who presents s/p a fall down stairs. Landed on right hip, No head injury. Had another fall last week which she was not seen for, bruised her right foot.   Plans care with [facility] and has appt scheduled, waiting for Medicaid.  Had Pap and cultures a few months ago at [facility]. Denies bleeding today.  OB History    Grav Para Term Preterm Abortions TAB SAB Ect Mult Living   3 1 1  1  1   1       Past Medical History  Diagnosis Date  . Pregnancy induced hypertension   . Asthma     ALLERGIES;IF GETS UPSET; TRIGGERS ASTHMA  . Infection     YEAST INF;NOT FREQ  . Infection     UTI;CURRENTLY TAKING MACROBID FOR TX OF UTI  . Chronic kidney disease     KIDNEY INF;WAS HOSPITALIZED X 3 DAYS    Past Surgical History  Procedure Date  . Wisdom tooth extraction     Family History  Problem Relation Age of Onset  . Anesthesia problems Neg Hx   . Hypertension Father     DECEASED  . Diabetes Father     IDDM  . Seizures Brother   . Seizures Cousin     MAT 1ST COUSIN  . Diabetes Sister     HALF SISTER;ORAL MEDS  . Kidney disease Maternal Grandmother     DIALYSIS  . Asthma Brother     2 BROTHERS  . Asthma Mother   . Asthma Sister     HALF SISTER  . GER disease Mother   . Alcohol abuse Father     History  Substance Use Topics  . Smoking status: Former Smoker -- 0.2 packs/day for 4 years    Types: Cigarettes    Quit date: 09/17/2011  . Smokeless tobacco: Never Used  . Alcohol Use: No    Allergies:  Allergies  Allergen Reactions  . Penicillins Rash    Reaction unknown    Prescriptions prior to admission  Medication Sig Dispense Refill  . acetaminophen (TYLENOL) 325 MG tablet Take 325 mg by mouth every 6 (six) hours as needed. For pain      . albuterol  (PROVENTIL HFA;VENTOLIN HFA) 108 (90 BASE) MCG/ACT inhaler Inhale 1-2 puffs into the lungs every 6 (six) hours as needed for wheezing.  1 Inhaler  1  . Ascorbic Acid (VITAMIN C) 1000 MG tablet Take 500 mg by mouth daily.       . Prenatal Vit-Fe Fumarate-FA (PRENATAL PO) Take 1 tablet by mouth daily.      . promethazine (PHENERGAN) 25 MG tablet Take 25 mg by mouth every 6 (six) hours as needed.      . promethazine (PHENERGAN) 25 MG suppository Place 1 suppository (25 mg total) rectally every 6 (six) hours as needed for nausea. May take vaginally.  12 each  2  . promethazine (PHENERGAN) 25 MG tablet Take 1 tablet (25 mg total) by mouth every 6 (six) hours as needed for nausea.  15 tablet  0  . promethazine (PHENERGAN) 25 MG tablet Take 1 tablet (25 mg total) by mouth every 6 (six) hours as needed for nausea.  30 tablet  0  ROS As in HPI  Physical Exam   Blood pressure 126/72, pulse 88, temperature 99.1 F (37.3 C), temperature source Oral, resp. rate 18, height 5\' 6"  (1.676 m), weight 171 lb 6.4 oz (77.747 kg), last menstrual period 09/24/2011, SpO2 100.00%.  Physical Exam  Constitutional: She is oriented to person, place, and time. She appears well-developed and well-nourished. No distress.  HENT:  Head: Normocephalic.  Cardiovascular: Normal rate.   Respiratory: Effort normal.  GI: Soft. She exhibits no distension and no mass. There is no tenderness (reports tenderness over RLQ but does not feel it with palpation). There is no rebound and no guarding.  Genitourinary: Vagina normal and uterus normal. No vaginal discharge found.       Cervix long and closed  Musculoskeletal: Normal range of motion.  Neurological: She is alert and oriented to person, place, and time.  Skin: Skin is warm and dry.  Psychiatric: She has a normal mood and affect.   FHR 160s on bedside US  Initially seems somewhat scattered, slightly confused.  ?had been sleeping?   Was able to adjust within a minute or  two.  MAU Course  Procedures  Assessment and Plan  A:  SIUP at [redacted]w[redacted]d      S/P fall      No prenatal care      Remote history of drug use  P:  Reassured pt that baby is well protected       Cautioned against fall risk      UDS sent       Cultures done recently at [facility] with Pap - deferred       Keep appt at [facility] 01/06/2012, 1:36 PM

## 2012-01-07 LAB — CULTURE, OB URINE
Colony Count: NO GROWTH
Organism ID, Bacteria: NO GROWTH

## 2012-01-07 LAB — GC/CHLAMYDIA PROBE AMP, GENITAL
Chlamydia, DNA Probe: NEGATIVE
GC Probe Amp, Genital: NEGATIVE

## 2012-01-07 NOTE — MAU Provider Note (Signed)
Chart reviewed and agree with management and plan.  

## 2012-01-12 ENCOUNTER — Ambulatory Visit (INDEPENDENT_AMBULATORY_CARE_PROVIDER_SITE_OTHER): Payer: Medicaid Other

## 2012-01-12 ENCOUNTER — Encounter: Payer: Self-pay | Admitting: Obstetrics and Gynecology

## 2012-01-12 ENCOUNTER — Ambulatory Visit (INDEPENDENT_AMBULATORY_CARE_PROVIDER_SITE_OTHER): Payer: Medicaid Other | Admitting: Obstetrics and Gynecology

## 2012-01-12 VITALS — BP 110/68 | Ht 66.0 in | Wt 172.0 lb

## 2012-01-12 DIAGNOSIS — O26849 Uterine size-date discrepancy, unspecified trimester: Secondary | ICD-10-CM

## 2012-01-12 DIAGNOSIS — O219 Vomiting of pregnancy, unspecified: Secondary | ICD-10-CM

## 2012-01-12 NOTE — Progress Notes (Signed)
Sono: 17w 1d by [place] , 15w 5d by dates  EFW  7 oz  >97th%tile  CX 3.40cm   Breech presentation, fundal placenta, fluid is normal. Normal ovaries, no fluid in CDS, normal adenexas.   Dating:  uncertain re LMP.  EDC= US=06/20/12 Quad screen today NOB labs reviewed. Anatomy US in 4 wks and will check growth then as well

## 2012-01-12 NOTE — Progress Notes (Signed)
Pt voices no complaints.

## 2012-01-15 LAB — AFP, QUAD SCREEN
AFP: 32.6 IU/mL
Age Alone: 1:938 {titer}
Down Syndrome Scr Risk Est: <1:38500 {titer}
HCG, Total: 23695 m[IU]/mL
INH: 106.6 pg/mL
Interpretation-AFP: NEGATIVE
MoM for AFP: 1.26
MoM for INH: 0.62
MoM for hCG: 1.08
Open Spina bifida: NEGATIVE
Osb Risk: 1:5690 {titer}
Tri 18 Scr Risk Est: NEGATIVE
Trisomy 18 (Edward) Syndrome Interp.: 1:108000 {titer}
uE3 Mom: 1.4

## 2012-01-18 ENCOUNTER — Other Ambulatory Visit: Payer: Self-pay | Admitting: Obstetrics and Gynecology

## 2012-01-18 DIAGNOSIS — O26849 Uterine size-date discrepancy, unspecified trimester: Secondary | ICD-10-CM

## 2012-01-18 LAB — US OB COMP + 14 WK

## 2012-01-27 ENCOUNTER — Ambulatory Visit (INDEPENDENT_AMBULATORY_CARE_PROVIDER_SITE_OTHER): Payer: Medicaid Other

## 2012-01-27 ENCOUNTER — Ambulatory Visit (INDEPENDENT_AMBULATORY_CARE_PROVIDER_SITE_OTHER): Payer: Medicaid Other | Admitting: Obstetrics and Gynecology

## 2012-01-27 ENCOUNTER — Other Ambulatory Visit: Payer: Self-pay | Admitting: Obstetrics and Gynecology

## 2012-01-27 VITALS — BP 102/60 | Wt 177.0 lb

## 2012-01-27 DIAGNOSIS — IMO0001 Reserved for inherently not codable concepts without codable children: Secondary | ICD-10-CM

## 2012-01-27 DIAGNOSIS — Z3689 Encounter for other specified antenatal screening: Secondary | ICD-10-CM

## 2012-01-27 DIAGNOSIS — Z331 Pregnant state, incidental: Secondary | ICD-10-CM

## 2012-01-27 NOTE — Progress Notes (Signed)
C/O: Rash on her back. Pt would like to discuss having a referral to see a Dentist.

## 2012-01-27 NOTE — Progress Notes (Signed)
[redacted]w[redacted]d 2 vessel cord: growth ultrasound will be planned Rash on back: pregnancy acne

## 2012-01-27 NOTE — Progress Notes (Signed)
Sono:  EFW 9oz +/- 0.81 oz AUA 54th%tile  CX  3.76 cm  Breech presentation. Posterior placenta. Normal fluid. AP pocket = 4.3 cm.  Measurements c/w LMP GA. EDD: 06/30/12 First u/s dated 01/12/12 gave an EDD of 06/20/12.  Please review for best dating. Noted is a 2 vessel umbilical cord. Right umbilical artery is present. No other fetal abnormality is seen.  Open hands, 5th digit seen. Female gender.  Suggest follow up growth at [redacted] weeks GA.  CX closed. Normal ovaries/adnexa.

## 2012-01-28 LAB — US OB COMP + 14 WK

## 2012-01-29 ENCOUNTER — Telehealth: Payer: Self-pay | Admitting: Obstetrics and Gynecology

## 2012-01-29 NOTE — Telephone Encounter (Signed)
Discussed post coital bleeding s/s bleeding to report, f/o as scheduled. Lavera Guise, CNM

## 2012-02-26 ENCOUNTER — Encounter: Payer: Self-pay | Admitting: Obstetrics and Gynecology

## 2012-02-26 ENCOUNTER — Ambulatory Visit (INDEPENDENT_AMBULATORY_CARE_PROVIDER_SITE_OTHER): Payer: Medicaid Other | Admitting: Obstetrics and Gynecology

## 2012-02-26 VITALS — BP 118/62 | Wt 181.0 lb

## 2012-02-26 DIAGNOSIS — IMO0001 Reserved for inherently not codable concepts without codable children: Secondary | ICD-10-CM

## 2012-02-26 DIAGNOSIS — Z331 Pregnant state, incidental: Secondary | ICD-10-CM

## 2012-02-26 DIAGNOSIS — Q270 Congenital absence and hypoplasia of umbilical artery: Secondary | ICD-10-CM

## 2012-02-26 MED ORDER — CONCEPT OB 130-92.4-1 MG PO CAPS: 1.0000 | ORAL_CAPSULE | Freq: Every day | ORAL | Status: DC

## 2012-02-26 NOTE — Progress Notes (Signed)
[redacted]w[redacted]d GFM but anxious because friend just had an IUFD: will review [facility] GERD:diet reviewed 2 vessel cord: growth ultrasound at next visit

## 2012-02-26 NOTE — Progress Notes (Signed)
[redacted]w[redacted]d

## 2012-03-25 ENCOUNTER — Encounter: Payer: Self-pay | Admitting: Obstetrics and Gynecology

## 2012-03-25 ENCOUNTER — Ambulatory Visit (INDEPENDENT_AMBULATORY_CARE_PROVIDER_SITE_OTHER): Payer: Medicaid Other | Admitting: Obstetrics and Gynecology

## 2012-03-25 ENCOUNTER — Ambulatory Visit (INDEPENDENT_AMBULATORY_CARE_PROVIDER_SITE_OTHER): Payer: Medicaid Other

## 2012-03-25 VITALS — BP 112/62 | Wt 189.0 lb

## 2012-03-25 DIAGNOSIS — Z331 Pregnant state, incidental: Secondary | ICD-10-CM

## 2012-03-25 DIAGNOSIS — J029 Acute pharyngitis, unspecified: Secondary | ICD-10-CM

## 2012-03-25 DIAGNOSIS — IMO0001 Reserved for inherently not codable concepts without codable children: Secondary | ICD-10-CM

## 2012-03-25 DIAGNOSIS — Q270 Congenital absence and hypoplasia of umbilical artery: Secondary | ICD-10-CM

## 2012-03-25 NOTE — Progress Notes (Signed)
[redacted]w[redacted]d Ultrasound today EFW 2 lbs. 4 oz. 72nd percentile, Consistent with dates cervix is 3.4 cm right breech presentation and posterior placenta and normal fluid cervix is closed. EFW q4wks secondary to 2v cord C/o sore throat - throat cx sent Glucola today

## 2012-03-26 LAB — GLUCOSE TOLERANCE, 1 HOUR (50G) W/O FASTING: Glucose, 1 Hour GTT: 103 mg/dL (ref 70–140)

## 2012-03-26 LAB — RPR

## 2012-03-27 LAB — CULTURE, GROUP A STREP

## 2012-03-28 LAB — US OB FOLLOW UP

## 2012-03-31 ENCOUNTER — Telehealth: Payer: Self-pay | Admitting: Obstetrics and Gynecology

## 2012-03-31 ENCOUNTER — Ambulatory Visit (INDEPENDENT_AMBULATORY_CARE_PROVIDER_SITE_OTHER): Payer: Medicaid Other | Admitting: Obstetrics and Gynecology

## 2012-03-31 VITALS — BP 102/58 | Wt 193.0 lb

## 2012-03-31 DIAGNOSIS — Z3490 Encounter for supervision of normal pregnancy, unspecified, unspecified trimester: Secondary | ICD-10-CM

## 2012-03-31 DIAGNOSIS — M549 Dorsalgia, unspecified: Secondary | ICD-10-CM

## 2012-03-31 DIAGNOSIS — Z331 Pregnant state, incidental: Secondary | ICD-10-CM

## 2012-03-31 LAB — POCT URINALYSIS DIPSTICK
Ketones, UA: NEGATIVE
Protein, UA: NEGATIVE
Spec Grav, UA: 1.01
Urobilinogen, UA: NEGATIVE
pH, UA: 7

## 2012-03-31 MED ORDER — CYCLOBENZAPRINE HCL 10 MG PO TABS: 10.0000 mg | ORAL_TABLET | Freq: Three times a day (TID) | ORAL | Status: DC | PRN

## 2012-03-31 NOTE — Progress Notes (Signed)
Pt stated been having some abdominal pressure with back pain. Pt stated no other issues today.

## 2012-03-31 NOTE — Progress Notes (Signed)
[redacted]w[redacted]d FH  Slightly smaller than dates but had growth USS 03/25/12 - growth 72th%tile. FM+ No change vaginal secretions C/o  Lt sided round ligament pain - advised. Also c/o back pain. Flexeril 10mg  po Q8 hrly prescribed. ROB x 2 weeks

## 2012-03-31 NOTE — Telephone Encounter (Signed)
Pt is 28w 3d c/o lower abdominal pressure x 3 days. Pt states she has pain when walking but is not experiencing any bleeding or discharge. Pt also states that the baby's movement is still normal. Pt is drinking 3-4 glasses of water per day, advised pt to increase to at least 8 glasses per day. Pt states that she is trying to avoid taking pain medication during pregnancy, advised that Tylenol is safe to take throughout entire pregnancy. Also advised pt to try taking a warm bath. Offered same day appointment for this afternoon with DD, pt agreeable and accepts appointment.

## 2012-04-08 ENCOUNTER — Encounter: Payer: Self-pay | Admitting: Obstetrics and Gynecology

## 2012-04-08 ENCOUNTER — Ambulatory Visit (INDEPENDENT_AMBULATORY_CARE_PROVIDER_SITE_OTHER): Payer: Medicaid Other | Admitting: Obstetrics and Gynecology

## 2012-04-08 VITALS — BP 112/62 | Wt 190.0 lb

## 2012-04-08 DIAGNOSIS — R300 Dysuria: Secondary | ICD-10-CM

## 2012-04-08 DIAGNOSIS — Z331 Pregnant state, incidental: Secondary | ICD-10-CM

## 2012-04-08 LAB — POCT URINALYSIS DIPSTICK
Bilirubin, UA: NEGATIVE
Blood, UA: NEGATIVE
Ketones, UA: NEGATIVE
Spec Grav, UA: 1.02
pH, UA: 6

## 2012-04-08 MED ORDER — VALACYCLOVIR HCL 1 G PO TABS: 2000.0000 mg | ORAL_TABLET | Freq: Two times a day (BID) | ORAL | Status: AC

## 2012-04-08 NOTE — Progress Notes (Signed)
[redacted]w[redacted]d Pt states that she is still having round ligament pain that goes to back.  Pt also believes that she may have uti

## 2012-04-08 NOTE — Patient Instructions (Signed)
Round Ligament Pain  The round ligament is made up of muscle and fibrous tissue. It is attached to the uterus near the fallopian tube. The round ligament is located on both sides of the uterus and helps support the position of the uterus. It usually begins in the second trimester of pregnancy when the uterus comes out of the pelvis. The pain can come and go until the baby is delivered. Round ligament pain is not a serious problem and does not cause harm to the baby.  CAUSE  During pregnancy the uterus grows the most from the second trimester to delivery. As it grows, it stretches and slightly twists the round ligaments. When the uterus leans from one side to the other, the round ligament on the opposite side pulls and stretches. This can cause pain.  SYMPTOMS   Pain can occur on one side or both sides. The pain is usually a short, sharp, and pinching-like. Sometimes it can be a dull, lingering and aching pain. The pain is located in the lower side of the abdomen or in the groin. The pain is internal and usually starts deep in the groin and moves up to the outside of the hip area. Pain can occur with:  · Sudden change in position like getting out of bed or a chair.  · Rolling over in bed.  · Coughing or sneezing.  · Walking too much.  · Any type of physical activity.  DIAGNOSIS   Your caregiver will make sure there are no serious problems causing the pain. When nothing serious is found, the symptoms usually indicate that the pain is from the round ligament.  TREATMENT   · Sit down and relax when the pain starts.  · Flex your knees up to your belly.  · Lay on your side with a pillow under your belly (abdomen) and another one between your legs.  · Sit in a hot bath for 15 to 20 minutes or until the pain goes away.  HOME CARE INSTRUCTIONS   · Only take over-the-counter or prescriptions medicines for pain, discomfort or fever as directed by your caregiver.  · Sit and stand slowly.  · Avoid long walks if it causes  pain.  · Stop or lessen your physical activities if it causes pain.  SEEK MEDICAL CARE IF:   · The pain does not go away with any of your treatment.  · You need stronger medication for the pain.  · You develop back pain that you did not have before with the side pain.  SEEK IMMEDIATE MEDICAL CARE IF:   · You develop a temperature of 102° F (38.9° C) or higher.  · You develop uterine contractions.  · You develop vaginal bleeding.  · You develop nausea, vomiting or diarrhea.  · You develop chills.  · You have pain when you urinate.  Document Released: 03/17/2008 Document Revised: 08/31/2011 Document Reviewed: 03/17/2008  Exit[employer]® Patient Information ©2013 [employer].

## 2012-04-08 NOTE — Progress Notes (Signed)
Pt with round ligament pain.  No evidence of a hernia and no relif with flexeril.  Try tylenol and PT t with fever blister.  I will give valtrex for this.   Results for orders placed in visit on 04/08/12  POCT URINALYSIS DIPSTICK      Component Value Range   Color, UA       Clarity, UA       Glucose, UA neg     Bilirubin, UA neg     Ketones, UA neg     Spec Grav, UA 1.020     Blood, UA neg     pH, UA 6.0     Protein, UA trace     Urobilinogen, UA negative     Nitrite, UA neg     Leukocytes, UA moderate (2+)    urine culture sent. Rt 2 weeks

## 2012-04-11 ENCOUNTER — Telehealth: Payer: Self-pay | Admitting: Obstetrics and Gynecology

## 2012-04-11 ENCOUNTER — Encounter: Payer: Self-pay | Admitting: Obstetrics and Gynecology

## 2012-04-11 LAB — CULTURE, OB URINE

## 2012-04-11 NOTE — Telephone Encounter (Signed)
Message left by pt stating has sore throat, sneezing, and swollen tonsils.   TC to pt. LM to return call or call after hours.Pt

## 2012-04-19 ENCOUNTER — Ambulatory Visit (INDEPENDENT_AMBULATORY_CARE_PROVIDER_SITE_OTHER): Payer: Medicaid Other | Admitting: Obstetrics and Gynecology

## 2012-04-19 ENCOUNTER — Encounter: Payer: Self-pay | Admitting: Obstetrics and Gynecology

## 2012-04-19 ENCOUNTER — Ambulatory Visit (INDEPENDENT_AMBULATORY_CARE_PROVIDER_SITE_OTHER): Payer: Medicaid Other

## 2012-04-19 VITALS — BP 122/60 | Wt 197.0 lb

## 2012-04-19 DIAGNOSIS — Q270 Congenital absence and hypoplasia of umbilical artery: Secondary | ICD-10-CM

## 2012-04-19 DIAGNOSIS — IMO0001 Reserved for inherently not codable concepts without codable children: Secondary | ICD-10-CM

## 2012-04-19 NOTE — Progress Notes (Signed)
[redacted]w[redacted]d

## 2012-04-19 NOTE — Progress Notes (Signed)
[redacted]w[redacted]d Glucola equals 103, hemoglobin 11.3, RPR nonreactive. Ultrasound: Vertex, normal fluid, cervix 3.86 cm, growth 82nd percentile. Return office in 2 weeks Doing well Ultrasound next visit because of a 2 vessel umbilical cord. Dr. Stefano Gaul

## 2012-04-22 LAB — US OB FOLLOW UP

## 2012-05-03 ENCOUNTER — Encounter: Payer: Self-pay | Admitting: Obstetrics and Gynecology

## 2012-05-03 ENCOUNTER — Ambulatory Visit (INDEPENDENT_AMBULATORY_CARE_PROVIDER_SITE_OTHER): Payer: Medicaid Other | Admitting: Obstetrics and Gynecology

## 2012-05-03 VITALS — BP 116/70 | Wt 200.0 lb

## 2012-05-03 DIAGNOSIS — Z331 Pregnant state, incidental: Secondary | ICD-10-CM

## 2012-05-03 NOTE — Patient Instructions (Signed)
Fetal Movement Counts Patient Name: __________________________________________________ Patient Due Date: ____________________ Kick counts is highly recommended in high risk pregnancies, but it is a good idea for every pregnant woman to do. Start counting fetal movements at 28 weeks of the pregnancy. Fetal movements increase after eating a full meal or eating or drinking something sweet (the blood sugar is higher). It is also important to drink plenty of fluids (well hydrated) before doing the count. Lie on your left side because it helps with the circulation or you can sit in a comfortable chair with your arms over your belly (abdomen) with no distractions around you. DOING THE COUNT  Try to do the count the same time of day each time you do it.  Mark the day and time, then see how long it takes for you to feel 10 movements (kicks, flutters, swishes, rolls). You should have at least 10 movements within 2 hours. You will most likely feel 10 movements in much less than 2 hours. If you do not, wait an hour and count again. After a couple of days you will see a pattern.  What you are looking for is a change in the pattern or not enough counts in 2 hours. Is it taking longer in time to reach 10 movements? SEEK MEDICAL CARE IF:  You feel less than 10 counts in 2 hours. Tried twice.  No movement in one hour.  The pattern is changing or taking longer each day to reach 10 counts in 2 hours.  You feel the baby is not moving as it usually does. Date: ____________ Movements: ____________ Start time: ____________ Finish time: ____________  Date: ____________ Movements: ____________ Start time: ____________ Finish time: ____________ Date: ____________ Movements: ____________ Start time: ____________ Finish time: ____________ Date: ____________ Movements: ____________ Start time: ____________ Finish time: ____________ Date: ____________ Movements: ____________ Start time: ____________ Finish time:  ____________ Date: ____________ Movements: ____________ Start time: ____________ Finish time: ____________ Date: ____________ Movements: ____________ Start time: ____________ Finish time: ____________ Date: ____________ Movements: ____________ Start time: ____________ Finish time: ____________  Date: ____________ Movements: ____________ Start time: ____________ Finish time: ____________ Date: ____________ Movements: ____________ Start time: ____________ Finish time: ____________ Date: ____________ Movements: ____________ Start time: ____________ Finish time: ____________ Date: ____________ Movements: ____________ Start time: ____________ Finish time: ____________ Date: ____________ Movements: ____________ Start time: ____________ Finish time: ____________ Date: ____________ Movements: ____________ Start time: ____________ Finish time: ____________ Date: ____________ Movements: ____________ Start time: ____________ Finish time: ____________  Date: ____________ Movements: ____________ Start time: ____________ Finish time: ____________ Date: ____________ Movements: ____________ Start time: ____________ Finish time: ____________ Date: ____________ Movements: ____________ Start time: ____________ Finish time: ____________ Date: ____________ Movements: ____________ Start time: ____________ Finish time: ____________ Date: ____________ Movements: ____________ Start time: ____________ Finish time: ____________ Date: ____________ Movements: ____________ Start time: ____________ Finish time: ____________ Date: ____________ Movements: ____________ Start time: ____________ Finish time: ____________  Date: ____________ Movements: ____________ Start time: ____________ Finish time: ____________ Date: ____________ Movements: ____________ Start time: ____________ Finish time: ____________ Date: ____________ Movements: ____________ Start time: ____________ Finish time: ____________ Date: ____________ Movements:  ____________ Start time: ____________ Finish time: ____________ Date: ____________ Movements: ____________ Start time: ____________ Finish time: ____________ Date: ____________ Movements: ____________ Start time: ____________ Finish time: ____________ Date: ____________ Movements: ____________ Start time: ____________ Finish time: ____________  Date: ____________ Movements: ____________ Start time: ____________ Finish time: ____________ Date: ____________ Movements: ____________ Start time: ____________ Finish time: ____________ Date: ____________ Movements: ____________ Start time: ____________ Finish time: ____________ Date: ____________ Movements:   ____________ Start time: ____________ Finish time: ____________ Date: ____________ Movements: ____________ Start time: ____________ Finish time: ____________ Date: ____________ Movements: ____________ Start time: ____________ Finish time: ____________ Date: ____________ Movements: ____________ Start time: ____________ Finish time: ____________  Date: ____________ Movements: ____________ Start time: ____________ Finish time: ____________ Date: ____________ Movements: ____________ Start time: ____________ Finish time: ____________ Date: ____________ Movements: ____________ Start time: ____________ Finish time: ____________ Date: ____________ Movements: ____________ Start time: ____________ Finish time: ____________ Date: ____________ Movements: ____________ Start time: ____________ Finish time: ____________ Date: ____________ Movements: ____________ Start time: ____________ Finish time: ____________ Date: ____________ Movements: ____________ Start time: ____________ Finish time: ____________  Date: ____________ Movements: ____________ Start time: ____________ Finish time: ____________ Date: ____________ Movements: ____________ Start time: ____________ Finish time: ____________ Date: ____________ Movements: ____________ Start time: ____________ Finish  time: ____________ Date: ____________ Movements: ____________ Start time: ____________ Finish time: ____________ Date: ____________ Movements: ____________ Start time: ____________ Finish time: ____________ Date: ____________ Movements: ____________ Start time: ____________ Finish time: ____________ Date: ____________ Movements: ____________ Start time: ____________ Finish time: ____________  Date: ____________ Movements: ____________ Start time: ____________ Finish time: ____________ Date: ____________ Movements: ____________ Start time: ____________ Finish time: ____________ Date: ____________ Movements: ____________ Start time: ____________ Finish time: ____________ Date: ____________ Movements: ____________ Start time: ____________ Finish time: ____________ Date: ____________ Movements: ____________ Start time: ____________ Finish time: ____________ Date: ____________ Movements: ____________ Start time: ____________ Finish time: ____________ Document Released: 07/08/2006 Document Revised: 08/31/2011 Document Reviewed: 01/08/2009 Exit[employer] Patient Information 2013 [employer].  

## 2012-05-03 NOTE — Progress Notes (Signed)
Pt request refill om inhaler

## 2012-05-03 NOTE — Progress Notes (Signed)
[redacted]w[redacted]d Pt hit her right thigh 2 weeks ago and now feels a knot on her thigh.  There is a 2cm superficial mass on her right thigh.  Will do doppler to r/o any clotting in the right thigh and leg [place] for growth @[facility]  pt with 2vc GBS with sensitivites Flu vaccine at [place] pt feels like she is getting a cold

## 2012-05-17 ENCOUNTER — Ambulatory Visit (INDEPENDENT_AMBULATORY_CARE_PROVIDER_SITE_OTHER): Payer: Medicaid Other

## 2012-05-17 ENCOUNTER — Ambulatory Visit (INDEPENDENT_AMBULATORY_CARE_PROVIDER_SITE_OTHER): Payer: Medicaid Other | Admitting: Obstetrics and Gynecology

## 2012-05-17 VITALS — BP 102/58 | Wt 201.0 lb

## 2012-05-17 DIAGNOSIS — O26849 Uterine size-date discrepancy, unspecified trimester: Secondary | ICD-10-CM

## 2012-05-17 DIAGNOSIS — Z331 Pregnant state, incidental: Secondary | ICD-10-CM

## 2012-05-17 DIAGNOSIS — Q270 Congenital absence and hypoplasia of umbilical artery: Secondary | ICD-10-CM

## 2012-05-17 NOTE — Progress Notes (Signed)
Pt stated c/o edema. Pt stated she still has a cold will not get the flu shot today. No other issues .  Ultrasound shows:  SIUP  S=D     [place] EDD: 13/31/13                                   EFW:5lb 3oz            AFI: 13.70cm           Cervical length: 3.50 cm Comments: AFI is normal 45th% 2VC noted,normal adenexas.            Placenta localization: posterior           Fetal presentation: vertex         Penicillin allergy but pt can take amoxicillin without difficulty GBS done, but no sensitivities

## 2012-05-19 ENCOUNTER — Encounter: Payer: Self-pay | Admitting: Obstetrics and Gynecology

## 2012-05-23 ENCOUNTER — Encounter: Payer: Self-pay | Admitting: Obstetrics and Gynecology

## 2012-05-23 ENCOUNTER — Ambulatory Visit (INDEPENDENT_AMBULATORY_CARE_PROVIDER_SITE_OTHER): Payer: Medicaid Other | Admitting: Obstetrics and Gynecology

## 2012-05-23 VITALS — BP 106/60 | Wt 206.0 lb

## 2012-05-23 DIAGNOSIS — Z3490 Encounter for supervision of normal pregnancy, unspecified, unspecified trimester: Secondary | ICD-10-CM

## 2012-05-23 DIAGNOSIS — Z331 Pregnant state, incidental: Secondary | ICD-10-CM

## 2012-05-23 LAB — US OB FOLLOW UP

## 2012-05-23 NOTE — Progress Notes (Addendum)
[redacted]w[redacted]d GBS neg FKCs and Labor Precautions RTO 1wk C/o?LOF - on exam ph 4.5, neg P, neg N

## 2012-05-24 ENCOUNTER — Inpatient Hospital Stay (HOSPITAL_COMMUNITY)
Admission: AD | Admit: 2012-05-24 | Discharge: 2012-05-24 | Disposition: A | Discharge status: Home / Self Care | Payer: Medicaid Other | Source: Ambulatory Visit | Attending: Obstetrics and Gynecology | Admitting: Obstetrics and Gynecology

## 2012-05-24 ENCOUNTER — Encounter (HOSPITAL_COMMUNITY): Payer: Self-pay | Admitting: *Deleted

## 2012-05-24 DIAGNOSIS — O4700 False labor before 37 completed weeks of gestation, unspecified trimester: Secondary | ICD-10-CM | POA: Insufficient documentation

## 2012-05-24 DIAGNOSIS — O479 False labor, unspecified: Secondary | ICD-10-CM

## 2012-05-24 NOTE — MAU Note (Signed)
Pt G3 P1 at 36.1wks having contractions every 2-41min.  Seen in the office today SVE 2-3cm.

## 2012-05-24 NOTE — MAU Note (Signed)
Pt states contractions started getting stronger at 2030. Pt states contractions 2-3 mins.

## 2012-05-25 ENCOUNTER — Telehealth: Payer: Self-pay | Admitting: Obstetrics and Gynecology

## 2012-05-26 NOTE — Telephone Encounter (Signed)
Molly from [facility] call to clarity on a study for pt. Informed Kirt Boys that it was already done and resulted. Molly voiced understanding.

## 2012-06-01 ENCOUNTER — Ambulatory Visit (INDEPENDENT_AMBULATORY_CARE_PROVIDER_SITE_OTHER): Payer: Medicaid Other | Admitting: Obstetrics and Gynecology

## 2012-06-01 ENCOUNTER — Encounter: Payer: Self-pay | Admitting: Obstetrics and Gynecology

## 2012-06-01 VITALS — BP 110/62 | Wt 207.0 lb

## 2012-06-01 DIAGNOSIS — Z331 Pregnant state, incidental: Secondary | ICD-10-CM

## 2012-06-01 NOTE — Progress Notes (Signed)
[redacted]w[redacted]d GBS negative GFM

## 2012-06-01 NOTE — Progress Notes (Signed)
[redacted]w[redacted]d Pt w/o complaint today, requests cervix check.

## 2012-06-06 ENCOUNTER — Telehealth: Payer: Self-pay | Admitting: Obstetrics and Gynecology

## 2012-06-06 NOTE — Telephone Encounter (Signed)
Tc to pt regarding msg.  Is 38 wks today and states that she has had a fever since yesterday and an earache, wanted to be seen.  Pt states has been taking Tylenol since 3 pm yesterday, has gotten as low as 99.9 and as high has 100.3.  Pt does report +fm, no bldg or LOF.  Pt is needing an appt scheduled for a weekly ROB visit.  Per VL, advised pt to continue taking the Tylenol around the clock, push fluids, rest and come to appt tomorrow for eval.  Pt advised to call with any concerns.

## 2012-06-07 ENCOUNTER — Ambulatory Visit (INDEPENDENT_AMBULATORY_CARE_PROVIDER_SITE_OTHER): Payer: Medicaid Other | Admitting: Obstetrics and Gynecology

## 2012-06-07 ENCOUNTER — Encounter: Payer: Self-pay | Admitting: Obstetrics and Gynecology

## 2012-06-07 VITALS — BP 90/68 | Temp 98.3°F | Wt 210.0 lb

## 2012-06-07 DIAGNOSIS — Z331 Pregnant state, incidental: Secondary | ICD-10-CM

## 2012-06-07 NOTE — Progress Notes (Signed)
Pt desires cervix check today.  

## 2012-06-07 NOTE — Progress Notes (Signed)
[redacted]w[redacted]d C/o fever & having earache both ears since Sunday Pt c/o dizziness; pt states she didn't have a meal this am.

## 2012-06-07 NOTE — Progress Notes (Signed)
[redacted]w[redacted]d Doing well. Returned office in 1 week. Dr. Stefano Gaul

## 2012-06-13 ENCOUNTER — Ambulatory Visit (INDEPENDENT_AMBULATORY_CARE_PROVIDER_SITE_OTHER): Payer: Medicaid Other | Admitting: Obstetrics and Gynecology

## 2012-06-13 VITALS — BP 112/60 | Wt 212.0 lb

## 2012-06-13 DIAGNOSIS — Z331 Pregnant state, incidental: Secondary | ICD-10-CM

## 2012-06-13 NOTE — Progress Notes (Signed)
Pt has no complaints Pt requests membranes stripped

## 2012-06-13 NOTE — Progress Notes (Signed)
[redacted]w[redacted]d Doing well. Membranes stripped. Return office in 1 week. Dr. Stefano Gaul

## 2012-06-17 ENCOUNTER — Encounter: Payer: Medicaid Other | Admitting: Obstetrics and Gynecology

## 2012-06-17 ENCOUNTER — Telehealth: Payer: Self-pay | Admitting: Obstetrics and Gynecology

## 2012-06-18 ENCOUNTER — Encounter (HOSPITAL_COMMUNITY): Payer: Self-pay | Admitting: Family

## 2012-06-18 ENCOUNTER — Telehealth: Payer: Self-pay | Admitting: Obstetrics and Gynecology

## 2012-06-18 ENCOUNTER — Inpatient Hospital Stay (HOSPITAL_COMMUNITY)
Admission: AD | Admit: 2012-06-18 | Discharge: 2012-06-18 | Disposition: A | Discharge status: Home / Self Care | Payer: Medicaid Other | Source: Ambulatory Visit | Attending: Obstetrics and Gynecology | Admitting: Obstetrics and Gynecology

## 2012-06-18 DIAGNOSIS — O99891 Other specified diseases and conditions complicating pregnancy: Secondary | ICD-10-CM | POA: Insufficient documentation

## 2012-06-18 DIAGNOSIS — O2343 Unspecified infection of urinary tract in pregnancy, third trimester: Secondary | ICD-10-CM | POA: Clinically undetermined

## 2012-06-18 DIAGNOSIS — O09299 Supervision of pregnancy with other poor reproductive or obstetric history, unspecified trimester: Secondary | ICD-10-CM

## 2012-06-18 DIAGNOSIS — O2390 Unspecified genitourinary tract infection in pregnancy, unspecified trimester: Secondary | ICD-10-CM | POA: Insufficient documentation

## 2012-06-18 DIAGNOSIS — N390 Urinary tract infection, site not specified: Secondary | ICD-10-CM | POA: Insufficient documentation

## 2012-06-18 DIAGNOSIS — N898 Other specified noninflammatory disorders of vagina: Secondary | ICD-10-CM

## 2012-06-18 LAB — URINALYSIS, ROUTINE W REFLEX MICROSCOPIC
Glucose, UA: NEGATIVE mg/dL
Hgb urine dipstick: NEGATIVE
Specific Gravity, Urine: >1.03 — ABNORMAL HIGH (ref 1.005–1.030)
Urobilinogen, UA: 1 mg/dL (ref 0.0–1.0)

## 2012-06-18 LAB — URINE MICROSCOPIC-ADD ON

## 2012-06-18 MED ORDER — CEPHALEXIN 500 MG PO CAPS: 500.0000 mg | ORAL_CAPSULE | Freq: Two times a day (BID) | ORAL | Status: DC

## 2012-06-18 NOTE — Telephone Encounter (Signed)
TC from patient--39 weeks, had sex early this am, with ? Leaking of fluid subsequently.  Still feels wet. No UCs, ? Slight decrease in FM.   Cervix has been 3 cm, 50% at last exam on 06/13/12. Advised patient the only definitive diagnosis is with evaluation and amnisure. Patient concerned about possibility of prolonged ROM.  Will see in MAU.

## 2012-06-18 NOTE — MAU Note (Signed)
Patient presents with increased vaginal leaking; warm, wet sensation noted at 0200 today after intercourse. Denies vaginal bleeding or cramping.

## 2012-06-18 NOTE — MAU Note (Signed)
Pt reports around 2am she had a gush of fluid . A little wet now but not much. No contractions felt at this time. Good fetal movment

## 2012-06-18 NOTE — MAU Provider Note (Signed)
History   26 yo G3P1011 at 60 5/7 weeks presented after calling c/o ? Leaking of fluid after IC last night.  Denies bleeding, reports +FM.  Cervix has been 3 cm, 50% in office last week.  Chief Complaint  Patient presents with  . Rupture of Membranes     OB History    Grav Para Term Preterm Abortions TAB SAB Ect Mult Living   3 1 1  1  1   1       Past Medical History  Diagnosis Date  . Pregnancy induced hypertension   . Asthma     ALLERGIES;IF GETS UPSET; TRIGGERS ASTHMA  . Infection     YEAST INF;NOT FREQ  . Infection     UTI;CURRENTLY TAKING MACROBID FOR TX OF UTI  . Chronic kidney disease     KIDNEY INF;WAS HOSPITALIZED X 3 DAYS    Past Surgical History  Procedure Date  . Wisdom tooth extraction     Family History  Problem Relation Age of Onset  . Anesthesia problems Neg Hx   . Hypertension Father     DECEASED  . Diabetes Father     IDDM  . Seizures Brother   . Seizures Cousin     MAT 1ST COUSIN  . Diabetes Sister     HALF SISTER;ORAL MEDS  . Kidney disease Maternal Grandmother     DIALYSIS  . Asthma Brother     2 BROTHERS  . Asthma Mother   . Asthma Sister     HALF SISTER  . GER disease Mother   . Alcohol abuse Father     History  Substance Use Topics  . Smoking status: Former Smoker -- 0.2 packs/day for 4 years    Types: Cigarettes    Quit date: 09/17/2011  . Smokeless tobacco: Never Used  . Alcohol Use: No    Allergies:  Allergies  Allergen Reactions  . Penicillins Rash    Has taken amoxicillin without difficulty    Prescriptions prior to admission  Medication Sig Dispense Refill  . acetaminophen (TYLENOL) 325 MG tablet Take 325 mg by mouth every 6 (six) hours as needed. For pain      . albuterol (PROVENTIL HFA;VENTOLIN HFA) 108 (90 BASE) MCG/ACT inhaler Inhale 1-2 puffs into the lungs every 6 (six) hours as needed for wheezing.  1 Inhaler  1  . calcium carbonate (TUMS - DOSED IN MG ELEMENTAL CALCIUM) 500 MG chewable tablet Chew 1  tablet by mouth daily.      . diphenhydrAMINE (BENADRYL) 25 MG tablet Take 25 mg by mouth every 6 (six) hours as needed. allergies      . neomycin-bacitracin-polymyxin (NEOSPORIN) ointment Apply 1 application topically every morning. Pt uses on upper lip      . Prenatal Vit-Fe Fumarate-FA (PRENATAL MULTIVITAMIN) TABS Take 1 tablet by mouth daily.      . Silicone (2ND SKIN SCARGEL) GEL Apply 1 application topically every morning. Pt uses on upper lip         Physical Exam   Blood pressure 126/71, pulse 110, temperature 97.9 F (36.6 C), temperature source Oral, resp. rate 18, height 5\' 6"  (1.676 m), weight 209 lb 12.8 oz (95.165 kg), last menstrual period 09/24/2011.  Chest clear Heart RRR without murmur Abd gravid, NT Pelvic--Amnisure done.  Cervix posterior, 3 cm, 50%, vtx, -2, IBOW palpated. Ext WNL  FHR reactive, with baseline 150, and broad cycles of accels with much FM. Sporadic UCs, mild  ED Course  IUP at  39 5/7 weeks ? Leaking--likely due to IC/cervical stimulation Reactive NST  Plan: Await amnisure.   Nigel Bridgeman CNM, [place] 06/18/2012 5:54 PM  Addendum: Patient advised she thought her urine had "funny" odor and was darker than usual. Results for orders placed during the hospital encounter of 06/18/12 (from the past 24 hour(s))  AMNISURE RUPTURE OF MEMBRANE (ROM)     Status: Normal   Collection Time   06/18/12  5:15 PM      Component Value Range   Amnisure ROM NEGATIVE    URINALYSIS, ROUTINE W REFLEX MICROSCOPIC     Status: Abnormal   Collection Time   06/18/12  5:48 PM      Component Value Range   Color, Urine YELLOW  YELLOW   APPearance CLOUDY (*) CLEAR   Specific Gravity, Urine >1.030 (*) 1.005 - 1.030   pH 6.0  5.0 - 8.0   Glucose, UA NEGATIVE  NEGATIVE mg/dL   Hgb urine dipstick NEGATIVE  NEGATIVE   Bilirubin Urine NEGATIVE  NEGATIVE   Ketones, ur NEGATIVE  NEGATIVE mg/dL   Protein, ur NEGATIVE  NEGATIVE mg/dL   Urobilinogen, UA 1.0  0.0 - 1.0 mg/dL     Nitrite POSITIVE (*) NEGATIVE   Leukocytes, UA NEGATIVE  NEGATIVE  URINE MICROSCOPIC-ADD ON     Status: Abnormal   Collection Time   06/18/12  5:48 PM      Component Value Range   Squamous Epithelial / LPF FEW (*) RARE   WBC, UA 0-2  <3 WBC/hpf   RBC / HPF 0-2  <3 RBC/hpf   Bacteria, UA MANY (*) RARE   Urine to culture. Amnisure negative.  Rechecked cervix, and swept membranes per patient request--3 cm, 60%, vtx, -2.  Impression: UTI No evidence ROM or labor  Plan: D/C home with labor precautions. Rx Keflex 500 mg po BID x 7 days (has taken amoxicillin in past without rxn, so doubt cross rxn to cephalosporins). To keep appt on 12/30 at [facility] or call prn with s/s labor, etc.  Nigel Bridgeman, CNM 06/18/12 7:10p  D/C'd home. Rx Keflex 500 mg po BID x 7 days

## 2012-06-20 ENCOUNTER — Ambulatory Visit (INDEPENDENT_AMBULATORY_CARE_PROVIDER_SITE_OTHER): Payer: Medicaid Other | Admitting: Obstetrics and Gynecology

## 2012-06-20 VITALS — BP 110/72 | Wt 210.0 lb

## 2012-06-20 DIAGNOSIS — Z3490 Encounter for supervision of normal pregnancy, unspecified, unspecified trimester: Secondary | ICD-10-CM

## 2012-06-20 DIAGNOSIS — Z331 Pregnant state, incidental: Secondary | ICD-10-CM

## 2012-06-20 NOTE — Progress Notes (Signed)
Treated for UTI at [facility] this week--on Keflex. Tolerating without difficulty, but did have vomiting x 1 last night.  None previously, and none since. More crampy last night. Cervix unchanged from LV--3, 50%, vtx, -1.  Membranes swept. NST NV.

## 2012-06-20 NOTE — Progress Notes (Signed)
[redacted]w[redacted]d  Pt desires cervix check today.  Pt states she has been cramping.  Pt states she was treated for an infection at the hospital Pt needs to leave a clean catch sample before leaving the office.

## 2012-06-22 NOTE — L&D Delivery Note (Signed)
Delivery Note  At 7:51 AM a viable and healthy female was delivered via Vaginal, Spontaneous Delivery (Presentation: ; Occiput Anterior).  APGAR:8 , 9; weight: pending .   Placenta status: intact.  Cord:  2 vessels.  Anesthesia: Epidural  Episiotomy: none Lacerations: none Suture Repair: NA Est. Blood Loss (mL): 300cc's.   Mom to postpartum.   Baby A to nursery-stable.   Baby B to NA.  Doron Shake V 06/25/2012, 8:05 AM

## 2012-06-24 ENCOUNTER — Encounter (HOSPITAL_COMMUNITY): Payer: Self-pay | Admitting: *Deleted

## 2012-06-24 ENCOUNTER — Inpatient Hospital Stay (HOSPITAL_COMMUNITY)
Admission: AD | Admit: 2012-06-24 | Discharge: 2012-06-24 | Disposition: A | Discharge status: Home / Self Care | Payer: Medicaid Other | Source: Ambulatory Visit | Attending: Obstetrics and Gynecology | Admitting: Obstetrics and Gynecology

## 2012-06-24 DIAGNOSIS — O09299 Supervision of pregnancy with other poor reproductive or obstetric history, unspecified trimester: Secondary | ICD-10-CM

## 2012-06-24 DIAGNOSIS — O2343 Unspecified infection of urinary tract in pregnancy, third trimester: Secondary | ICD-10-CM

## 2012-06-24 DIAGNOSIS — O2390 Unspecified genitourinary tract infection in pregnancy, unspecified trimester: Secondary | ICD-10-CM | POA: Insufficient documentation

## 2012-06-24 DIAGNOSIS — N390 Urinary tract infection, site not specified: Secondary | ICD-10-CM | POA: Insufficient documentation

## 2012-06-24 DIAGNOSIS — O479 False labor, unspecified: Secondary | ICD-10-CM | POA: Insufficient documentation

## 2012-06-24 MED ORDER — PROMETHAZINE HCL 25 MG/ML IJ SOLN
25.0000 mg | Freq: Once | INTRAMUSCULAR | Status: AC
  Administered 2012-06-24: 25 mg via INTRAMUSCULAR
  Filled 2012-06-24: qty 1

## 2012-06-24 MED ORDER — NALBUPHINE HCL 10 MG/ML IJ SOLN
10.0000 mg | Freq: Once | INTRAMUSCULAR | Status: AC
  Administered 2012-06-24: 10 mg via INTRAMUSCULAR
  Filled 2012-06-24: qty 1

## 2012-06-24 MED ORDER — NALBUPHINE HCL 10 MG/ML IJ SOLN
10.0000 mg | Freq: Once | INTRAMUSCULAR | Status: DC
  Filled 2012-06-24: qty 1

## 2012-06-24 MED ORDER — PROMETHAZINE HCL 25 MG/ML IJ SOLN: 25.0000 mg | Freq: Once | INTRAMUSCULAR | Status: DC

## 2012-06-24 NOTE — MAU Note (Signed)
C/o ucs since last night;

## 2012-06-24 NOTE — MAU Note (Signed)
Diagnosed with UTI and given rx on 06/20/12 but pt has not been taking her ATBs due to nausea; Cervical exam was same as it was on 06/20/12;

## 2012-06-24 NOTE — MAU Provider Note (Signed)
History     CSN: [id number]  Arrival date and time: 06/24/12 2111   First Provider Initiated Contact with Patient 06/24/12 2151      Chief Complaint  Patient presents with  . Labor Eval   HPI Comments: Pt is a G3P1 at [redacted]w[redacted]d that arrives after calling w c/o ctx that have been 5 min apart the last hour, back pain. Denies VB, LOF, +FM.  Pt was rx'd Keflex for UTI and states she hasn't been taking it because of it causing nausea, pt has phenergan at home but hasn't taken that either. Admits to not drinking much water today.      Past Medical History  Diagnosis Date  . Pregnancy induced hypertension   . Asthma     ALLERGIES;IF GETS UPSET; TRIGGERS ASTHMA  . Infection     YEAST INF;NOT FREQ  . Infection     UTI;CURRENTLY TAKING MACROBID FOR TX OF UTI  . Chronic kidney disease     KIDNEY INF;WAS HOSPITALIZED X 3 DAYS    Past Surgical History  Procedure Date  . Wisdom tooth extraction     Family History  Problem Relation Age of Onset  . Anesthesia problems Neg Hx   . Hypertension Father     DECEASED  . Diabetes Father     IDDM  . Seizures Brother   . Seizures Cousin     MAT 1ST COUSIN  . Diabetes Sister     HALF SISTER;ORAL MEDS  . Kidney disease Maternal Grandmother     DIALYSIS  . Asthma Brother     2 BROTHERS  . Asthma Mother   . Asthma Sister     HALF SISTER  . GER disease Mother   . Alcohol abuse Father     History  Substance Use Topics  . Smoking status: Former Smoker -- 0.2 packs/day for 4 years    Types: Cigarettes    Quit date: 09/17/2011  . Smokeless tobacco: Never Used  . Alcohol Use: No    Allergies:  Allergies  Allergen Reactions  . Penicillins Rash    Has taken amoxicillin without difficulty    Prescriptions prior to admission  Medication Sig Dispense Refill  . acetaminophen (TYLENOL) 325 MG tablet Take 325 mg by mouth every 6 (six) hours as needed. For pain      . albuterol (PROVENTIL HFA;VENTOLIN HFA) 108 (90 BASE) MCG/ACT inhaler  Inhale 1-2 puffs into the lungs every 6 (six) hours as needed for wheezing.  1 Inhaler  1  . calcium carbonate (TUMS - DOSED IN MG ELEMENTAL CALCIUM) 500 MG chewable tablet Chew 1 tablet by mouth daily.      . diphenhydrAMINE (BENADRYL) 25 MG tablet Take 25 mg by mouth every 6 (six) hours as needed. allergies      . Prenatal Vit-Fe Fumarate-FA (PRENATAL MULTIVITAMIN) TABS Take 1 tablet by mouth daily.        Review of Systems  Gastrointestinal: Positive for nausea and abdominal pain.  Genitourinary: Negative for dysuria and urgency.  Musculoskeletal: Positive for back pain.  All other systems reviewed and are negative.   Physical Exam   Blood pressure 129/77, pulse 100, temperature 97 F (36.1 C), temperature source Oral, resp. rate 20, height 5\' 6"  (1.676 m), weight 210 lb (95.255 kg), last menstrual period 09/24/2011.  Physical Exam  Nursing note and vitals reviewed. Constitutional: She is oriented to person, place, and time. She appears well-developed and well-nourished. She appears distressed.  Pt tearful, states she wants to be induced   HENT:  Head: Normocephalic.  Eyes: Pupils are equal, round, and reactive to light.  Neck: Normal range of motion.  Cardiovascular: Normal rate, regular rhythm and normal heart sounds.   Respiratory: Effort normal and breath sounds normal.  GI: Soft. Bowel sounds are normal.  Genitourinary: Vagina normal.  Musculoskeletal: Normal range of motion.  Neurological: She is alert and oriented to person, place, and time. She has normal reflexes.  Skin: Skin is warm and dry.  Psychiatric: She has a normal mood and affect. Her behavior is normal.   FHR cat 1 toco mild UI  MAU Course  Procedures    Assessment and Plan  IUP at [redacted]w[redacted]d UTI  Take phenergan for nausea, complete keflex course PO hydration Nubain/phenergan IM for therapeutic rest  D/c home  [facility] and labor rv'd F/u Monday as scheduled   Kayna Suppa M 06/24/2012, 9:59 PM

## 2012-06-25 ENCOUNTER — Inpatient Hospital Stay (HOSPITAL_COMMUNITY): Payer: Medicaid Other | Admitting: Anesthesiology

## 2012-06-25 ENCOUNTER — Inpatient Hospital Stay (HOSPITAL_COMMUNITY)
Admission: AD | Admit: 2012-06-25 | Discharge: 2012-06-26 | DRG: 775 | Disposition: A | Discharge status: Home / Self Care | Payer: Medicaid Other | Source: Ambulatory Visit | Attending: Obstetrics and Gynecology | Admitting: Obstetrics and Gynecology

## 2012-06-25 ENCOUNTER — Encounter (HOSPITAL_COMMUNITY): Payer: Self-pay | Admitting: *Deleted

## 2012-06-25 ENCOUNTER — Encounter (HOSPITAL_COMMUNITY): Payer: Self-pay | Admitting: Anesthesiology

## 2012-06-25 DIAGNOSIS — O09299 Supervision of pregnancy with other poor reproductive or obstetric history, unspecified trimester: Secondary | ICD-10-CM

## 2012-06-25 DIAGNOSIS — F319 Bipolar disorder, unspecified: Secondary | ICD-10-CM | POA: Diagnosis present

## 2012-06-25 DIAGNOSIS — F1911 Other psychoactive substance abuse, in remission: Secondary | ICD-10-CM | POA: Diagnosis present

## 2012-06-25 DIAGNOSIS — O2343 Unspecified infection of urinary tract in pregnancy, third trimester: Secondary | ICD-10-CM

## 2012-06-25 LAB — CBC
MCV: 91.5 fL (ref 78.0–100.0)
Platelets: 167 10*3/uL (ref 150–400)
RDW: 13 % (ref 11.5–15.5)
WBC: 10.6 10*3/uL — ABNORMAL HIGH (ref 4.0–10.5)

## 2012-06-25 MED ORDER — CEPHALEXIN 500 MG PO CAPS
500.0000 mg | ORAL_CAPSULE | Freq: Two times a day (BID) | ORAL | Status: DC
  Administered 2012-06-25 – 2012-06-26 (×2): 500 mg via ORAL
  Filled 2012-06-25 (×4): qty 1

## 2012-06-25 MED ORDER — WITCH HAZEL-GLYCERIN EX PADS: 1.0000 "application " | MEDICATED_PAD | CUTANEOUS | Status: DC | PRN

## 2012-06-25 MED ORDER — OXYTOCIN 40 UNITS IN LACTATED RINGERS INFUSION - SIMPLE MED
62.5000 mL/h | INTRAVENOUS | Status: DC
  Filled 2012-06-25: qty 1000

## 2012-06-25 MED ORDER — PHENYLEPHRINE 40 MCG/ML (10ML) SYRINGE FOR IV PUSH (FOR BLOOD PRESSURE SUPPORT): 80.0000 ug | PREFILLED_SYRINGE | INTRAVENOUS | Status: DC | PRN

## 2012-06-25 MED ORDER — ONDANSETRON HCL 4 MG/2ML IJ SOLN: 4.0000 mg | Freq: Four times a day (QID) | INTRAMUSCULAR | Status: DC | PRN

## 2012-06-25 MED ORDER — LACTATED RINGERS IV SOLN: 500.0000 mL | INTRAVENOUS | Status: DC | PRN

## 2012-06-25 MED ORDER — IBUPROFEN 600 MG PO TABS: 600.0000 mg | ORAL_TABLET | Freq: Four times a day (QID) | ORAL | Status: DC | PRN

## 2012-06-25 MED ORDER — DIPHENHYDRAMINE HCL 50 MG/ML IJ SOLN
12.5000 mg | INTRAMUSCULAR | Status: DC | PRN
  Administered 2012-06-25: 12.5 mg via INTRAVENOUS
  Filled 2012-06-25: qty 1

## 2012-06-25 MED ORDER — ONDANSETRON HCL 4 MG/2ML IJ SOLN: 4.0000 mg | INTRAMUSCULAR | Status: DC | PRN

## 2012-06-25 MED ORDER — FENTANYL 2.5 MCG/ML BUPIVACAINE 1/10 % EPIDURAL INFUSION (WH - ANES)
14.0000 mL/h | INTRAMUSCULAR | Status: DC
  Administered 2012-06-25: 14 mL/h via EPIDURAL
  Filled 2012-06-25: qty 125

## 2012-06-25 MED ORDER — LANOLIN HYDROUS EX OINT: TOPICAL_OINTMENT | CUTANEOUS | Status: DC | PRN

## 2012-06-25 MED ORDER — ONDANSETRON HCL 4 MG PO TABS: 4.0000 mg | ORAL_TABLET | ORAL | Status: DC | PRN

## 2012-06-25 MED ORDER — EPHEDRINE 5 MG/ML INJ
10.0000 mg | INTRAVENOUS | Status: DC | PRN
  Filled 2012-06-25: qty 4

## 2012-06-25 MED ORDER — OXYCODONE-ACETAMINOPHEN 5-325 MG PO TABS: 1.0000 | ORAL_TABLET | ORAL | Status: DC | PRN

## 2012-06-25 MED ORDER — PHENYLEPHRINE 40 MCG/ML (10ML) SYRINGE FOR IV PUSH (FOR BLOOD PRESSURE SUPPORT)
80.0000 ug | PREFILLED_SYRINGE | INTRAVENOUS | Status: DC | PRN
  Filled 2012-06-25: qty 5

## 2012-06-25 MED ORDER — OXYCODONE-ACETAMINOPHEN 5-325 MG PO TABS
1.0000 | ORAL_TABLET | ORAL | Status: DC | PRN
  Administered 2012-06-25 (×2): 1 via ORAL
  Administered 2012-06-26 (×2): 2 via ORAL
  Administered 2012-06-26: 1 via ORAL
  Filled 2012-06-25: qty 1
  Filled 2012-06-25 (×2): qty 2
  Filled 2012-06-25 (×2): qty 1

## 2012-06-25 MED ORDER — LACTATED RINGERS IV SOLN
INTRAVENOUS | Status: DC
  Administered 2012-06-25: 03:00:00 via INTRAVENOUS

## 2012-06-25 MED ORDER — MEDROXYPROGESTERONE ACETATE 150 MG/ML IM SUSP: 150.0000 mg | INTRAMUSCULAR | Status: DC | PRN

## 2012-06-25 MED ORDER — DIPHENHYDRAMINE HCL 25 MG PO CAPS: 25.0000 mg | ORAL_CAPSULE | Freq: Four times a day (QID) | ORAL | Status: DC | PRN

## 2012-06-25 MED ORDER — TETANUS-DIPHTH-ACELL PERTUSSIS 5-2.5-18.5 LF-MCG/0.5 IM SUSP
0.5000 mL | Freq: Once | INTRAMUSCULAR | Status: AC
  Administered 2012-06-25: 0.5 mL via INTRAMUSCULAR
  Filled 2012-06-25: qty 0.5

## 2012-06-25 MED ORDER — PRENATAL MULTIVITAMIN CH
1.0000 | ORAL_TABLET | Freq: Every day | ORAL | Status: DC
  Administered 2012-06-25 – 2012-06-26 (×2): 1 via ORAL
  Filled 2012-06-25 (×2): qty 1

## 2012-06-25 MED ORDER — SIMETHICONE 80 MG PO CHEW: 80.0000 mg | CHEWABLE_TABLET | ORAL | Status: DC | PRN

## 2012-06-25 MED ORDER — LIDOCAINE HCL (PF) 1 % IJ SOLN
INTRAMUSCULAR | Status: DC | PRN
  Administered 2012-06-25 (×2): 5 mL

## 2012-06-25 MED ORDER — OXYTOCIN BOLUS FROM INFUSION: 500.0000 mL | INTRAVENOUS | Status: DC

## 2012-06-25 MED ORDER — EPHEDRINE 5 MG/ML INJ: 10.0000 mg | INTRAVENOUS | Status: DC | PRN

## 2012-06-25 MED ORDER — IBUPROFEN 600 MG PO TABS
600.0000 mg | ORAL_TABLET | Freq: Four times a day (QID) | ORAL | Status: DC
  Administered 2012-06-25 – 2012-06-26 (×5): 600 mg via ORAL
  Filled 2012-06-25 (×5): qty 1

## 2012-06-25 MED ORDER — LACTATED RINGERS IV SOLN
500.0000 mL | Freq: Once | INTRAVENOUS | Status: AC
  Administered 2012-06-25: 500 mL via INTRAVENOUS

## 2012-06-25 MED ORDER — FENTANYL CITRATE 0.05 MG/ML IJ SOLN: 100.0000 ug | INTRAMUSCULAR | Status: DC | PRN

## 2012-06-25 MED ORDER — SENNOSIDES-DOCUSATE SODIUM 8.6-50 MG PO TABS
2.0000 | ORAL_TABLET | Freq: Every day | ORAL | Status: DC
  Administered 2012-06-25: 2 via ORAL

## 2012-06-25 MED ORDER — CITRIC ACID-SODIUM CITRATE 334-500 MG/5ML PO SOLN: 30.0000 mL | ORAL | Status: DC | PRN

## 2012-06-25 MED ORDER — BENZOCAINE-MENTHOL 20-0.5 % EX AERO
1.0000 "application " | INHALATION_SPRAY | CUTANEOUS | Status: DC | PRN
  Filled 2012-06-25: qty 56

## 2012-06-25 MED ORDER — ZOLPIDEM TARTRATE 5 MG PO TABS: 5.0000 mg | ORAL_TABLET | Freq: Every evening | ORAL | Status: DC | PRN

## 2012-06-25 MED ORDER — MEASLES, MUMPS & RUBELLA VAC ~~LOC~~ INJ
0.5000 mL | INJECTION | Freq: Once | SUBCUTANEOUS | Status: DC
  Filled 2012-06-25: qty 0.5

## 2012-06-25 MED ORDER — LIDOCAINE HCL (PF) 1 % IJ SOLN: 30.0000 mL | INTRAMUSCULAR | Status: DC | PRN

## 2012-06-25 MED ORDER — ACETAMINOPHEN 500 MG PO TABS: 1000.0000 mg | ORAL_TABLET | ORAL | Status: DC | PRN

## 2012-06-25 MED ORDER — DIBUCAINE 1 % RE OINT: 1.0000 "application " | TOPICAL_OINTMENT | RECTAL | Status: DC | PRN

## 2012-06-25 NOTE — MAU Note (Signed)
Patient states she thinks her water broke at 1:20am

## 2012-06-25 NOTE — Anesthesia Preprocedure Evaluation (Signed)
Anesthesia Evaluation  Patient identified by MRN, date of birth, ID band Patient awake    Reviewed: Allergy & Precautions, H&P , Patient's Chart, lab work & pertinent test results  Airway Mallampati: II TM Distance: >3 FB Neck ROM: full    Dental No notable dental hx.    Pulmonary neg pulmonary ROS, asthma ,  breath sounds clear to auscultation  Pulmonary exam normal       Cardiovascular negative cardio ROS  Rhythm:regular Rate:Normal     Neuro/Psych negative neurological ROS  negative psych ROS   GI/Hepatic negative GI ROS, Neg liver ROS,   Endo/Other  negative endocrine ROS  Renal/GU negative Renal ROS     Musculoskeletal   Abdominal   Peds  Hematology negative hematology ROS (+)   Anesthesia Other Findings   Reproductive/Obstetrics (+) Pregnancy                           Anesthesia Physical Anesthesia Plan  ASA: II  Anesthesia Plan: Epidural   Post-op Pain Management:    Induction:   Airway Management Planned:   Additional Equipment:   Intra-op Plan:   Post-operative Plan:   Informed Consent: I have reviewed the patients History and Physical, chart, labs and discussed the procedure including the risks, benefits and alternatives for the proposed anesthesia with the patient or authorized representative who has indicated his/her understanding and acceptance.     Plan Discussed with:   Anesthesia Plan Comments:         Anesthesia Quick Evaluation  

## 2012-06-25 NOTE — H&P (Signed)
[name] [name] is a 27 y.o. female presenting for labor, was seen in MAU about 9pm and rcv'd therapeutic rest, states she did get some sleep then woke up w worsening ctx and ?ROM. Denies VB, +FM. Pt is very anxious, requesting epidural   HPI: Pt began [facility] at 14wks,  Dating based on LMP =1/9 [place] dating =12/30 at 17wks Final EDC 12/30 Anatomy US at 19wks c/w EDC 12/30, 2VC noted, otherwise normal Pt did not receive genetic testing Serial growth US WNL TWG =50# tx'd for UTI w keflex on 12/28, tho did not complete tx and UA cx results not available   Maternal Medical History:  Reason for admission: Reason for admission: rupture of membranes and contractions.  Contractions: Onset was 6-12 hours ago.   Frequency: regular.   Perceived severity is moderate.    Fetal activity: Perceived fetal activity is normal.   Last perceived fetal movement was within the past hour.    Prenatal complications: no prenatal complications   OB History    Grav Para Term Preterm Abortions TAB SAB Ect Mult Living   3 1 1  1  1   1      Past Medical History  Diagnosis Date  . Pregnancy induced hypertension   . Asthma     ALLERGIES;IF GETS UPSET; TRIGGERS ASTHMA  . Infection     YEAST INF;NOT FREQ  . Infection     UTI;CURRENTLY TAKING MACROBID FOR TX OF UTI  . Chronic kidney disease     KIDNEY INF;WAS HOSPITALIZED X 3 DAYS   Past Surgical History  Procedure Date  . Wisdom tooth extraction    Family History: family history includes Alcohol abuse in her father; Asthma in her brother, mother, and sister; Diabetes in her father and sister; GER disease in her mother; Hypertension in her father; Kidney disease in her maternal grandmother; and Seizures in her brother and cousin.  There is no history of Anesthesia problems. Social History:  reports that she quit smoking about 9 months ago. Her smoking use included Cigarettes. She has a .8 pack-year smoking history. She has never used smokeless tobacco.  She reports that she does not drink alcohol or use illicit drugs.   Prenatal Transfer Tool  Maternal Diabetes: No Genetic Screening: Declined Maternal Ultrasounds/Referrals: Abnormal:  Findings:   Other: 2VC  Fetal Ultrasounds or other Referrals:  None Maternal Substance Abuse:  No  Significant Maternal Medications:  None Significant Maternal Lab Results:  Lab values include: Group B Strep negative Other Comments:  None  Review of Systems  All other systems reviewed and are negative.      Blood pressure 134/75, pulse 92, temperature 97.3 F (36.3 C), temperature source Oral, resp. rate 18, last menstrual period 09/24/2011. Maternal Exam:  Uterine Assessment: Contraction strength is moderate.  Contraction duration is 60 seconds. Contraction frequency is regular.   Abdomen: Patient reports no abdominal tenderness. Fundal height is aga.   Estimated fetal weight is 7#.   Fetal presentation: vertex  Introitus: Normal vulva. Normal vagina.  Ferning test: not done.  Amniotic fluid character: clear.  Pelvis: adequate for delivery.   Cervix: Cervix evaluated by digital exam.     Fetal Exam Fetal Monitor Review: Mode: ultrasound.   Baseline rate: 130.  Variability: moderate (6-25 bpm).   Pattern: accelerations present and no decelerations.    Fetal State Assessment: Category I - tracings are normal.     Physical Exam  Nursing note and vitals reviewed. Constitutional: She  is oriented to person, place, and time. She appears well-developed and well-nourished. She appears distressed.       Pt crying and moaning w ctx   HENT:  Head: Normocephalic.  Eyes: Pupils are equal, round, and reactive to light.  Neck: Normal range of motion.  Cardiovascular: Normal rate, regular rhythm and normal heart sounds.   Respiratory: Effort normal and breath sounds normal.  GI: Soft. Bowel sounds are normal.  Genitourinary: Vagina normal.       Mod amt clear fluid, bloody show     Musculoskeletal: Normal range of motion. She exhibits no edema.  Neurological: She is alert and oriented to person, place, and time. She has normal reflexes.  Skin: Skin is warm and dry.  Psychiatric: She has a normal mood and affect. Her behavior is normal.    Prenatal labs: ABO, Rh: O/POS/-- (06/10 1556) Antibody: NEG (06/10 1556) Rubella: 44.5 (06/10 1556) RPR: NON REAC (10/04 1523)  HBsAg: NEGATIVE (06/10 1556)  HIV: NON REACTIVE (06/10 1556)  GBS: NEGATIVE (11/26 1249)  GC/CT neg 1hr gtt WNL   Assessment/Plan: IUP at [redacted]w[redacted]d Active labor FHR reassuring GBS neg  Admit to b.s per c/w Dr Stefano Gaul Routine L&D orders Epidural ASAP Expectant mgmnt    Miyu Fenderson M 06/25/2012, 3:06 AM

## 2012-06-25 NOTE — Anesthesia Postprocedure Evaluation (Signed)
  Anesthesia Post-op Note  Patient: [name] [name]  Procedure(s) Performed: * No procedures listed *  Patient Location: PACU and Mother/Baby  Anesthesia Type:Epidural  Level of Consciousness: awake, alert  and oriented  Airway and Oxygen Therapy: Patient Spontanous Breathing     Post-op Assessment: Patient's Cardiovascular Status Stable and Respiratory Function Stable  Post-op Vital Signs: stable  Complications: No apparent anesthesia complications

## 2012-06-25 NOTE — Anesthesia Procedure Notes (Signed)
Epidural Patient location during procedure: OB Start time: 06/25/2012 3:51 AM  Staffing Anesthesiologist: Angus Seller., Harrell Gave. Performed by: anesthesiologist   Preanesthetic Checklist Completed: patient identified, site marked, surgical consent, pre-op evaluation, timeout performed, IV checked, risks and benefits discussed and monitors and equipment checked  Epidural Patient position: sitting Prep: site prepped and draped and DuraPrep Patient monitoring: continuous pulse ox and blood pressure Approach: midline Injection technique: LOR air and LOR saline  Needle:  Needle type: Tuohy  Needle gauge: 17 G Needle length: 9 cm and 9 Needle insertion depth: 6 cm Catheter type: closed end flexible Catheter size: 19 Gauge Catheter at skin depth: 12 cm Test dose: negative  Assessment Events: blood not aspirated, injection not painful, no injection resistance, negative IV test and no paresthesia  Additional Notes Patient identified.  Risk benefits discussed including failed block, incomplete pain control, headache, nerve damage, paralysis, blood pressure changes, nausea, vomiting, reactions to medication both toxic or allergic, and postpartum back pain.  Patient expressed understanding and wished to proceed.  All questions were answered.  Sterile technique used throughout procedure and epidural site dressed with sterile barrier dressing. No paresthesia or other complications noted.The patient did not experience any signs of intravascular injection such as tinnitus or metallic taste in mouth nor signs of intrathecal spread such as rapid motor block. Please see nursing notes for vital signs.

## 2012-06-25 NOTE — Progress Notes (Signed)
Patient ID: [name] [name], female   DOB: 1985/06/30, 27 y.o.   MRN: [id number] Pt is 27 y.o. and at [redacted]w[redacted]d   .Subjective:  Pt comfortable w epidural, tho has been restless, now c/o itching, denies pressure  Objective: BP 118/57  Pulse 97  Temp 97.7 F (36.5 C) (Oral)  Resp 20  LMP 09/24/2011      FHT:  130, mod variability, +accels, no decels  UC:   regular, every 2-3 minutes, toco not tracing well at times SVE:   Dilation: 8 Effacement (%): 100 Station: -1 Exam by:: S.Shaun Runyon CNM    Assessment: Fetal Wellbeing:  Category I Pain Control:  Epidural Spontaneous labor, progressing normally    Plan:  Expectant mgmnt  Dr Stefano Gaul updated   Malissa Hippo 06/25/2012, 7:29 AM

## 2012-06-25 NOTE — Progress Notes (Signed)
 >>   [name], [name] [name] 06/25/2012 16:11 Pt allergic to Pcn but can tolerate oral amoxicillin.    Rn reports that Pt. Has tolerated Keflex before.

## 2012-06-26 LAB — CBC
MCH: 30.8 pg (ref 26.0–34.0)
MCHC: 33.1 g/dL (ref 30.0–36.0)
MCV: 93.3 fL (ref 78.0–100.0)
Platelets: 165 10*3/uL (ref 150–400)
RDW: 13.3 % (ref 11.5–15.5)

## 2012-06-26 MED ORDER — OXYCODONE-ACETAMINOPHEN 5-325 MG PO TABS: 1.0000 | ORAL_TABLET | ORAL | Status: DC | PRN

## 2012-06-26 MED ORDER — IBUPROFEN 600 MG PO TABS: 600.0000 mg | ORAL_TABLET | Freq: Four times a day (QID) | ORAL | Status: DC | PRN

## 2012-06-26 MED ORDER — INFLUENZA VIRUS VACC SPLIT PF IM SUSP
0.5000 mL | Freq: Once | INTRAMUSCULAR | Status: AC
  Administered 2012-06-26: 0.5 mL via INTRAMUSCULAR
  Filled 2012-06-26: qty 0.5

## 2012-06-26 MED ORDER — PNEUMOCOCCAL VAC POLYVALENT 25 MCG/0.5ML IJ INJ
0.5000 mL | INJECTION | Freq: Once | INTRAMUSCULAR | Status: AC
  Administered 2012-06-26: 0.5 mL via INTRAMUSCULAR
  Filled 2012-06-26: qty 0.5

## 2012-06-26 NOTE — Progress Notes (Signed)
Post Partum Day 1:S/P 1 Subjective: Patient up ad lib, denies syncope or dizziness. Feeding:  Breast Contraceptive plan:   Considering Mirena (previous user) or Nuvaring, if not breastfeeding by 6 weeks.  Objective: Blood pressure 120/69, pulse 97, temperature 97.9 F (36.6 C), temperature source Oral, resp. rate 18, height 5\' 6"  (1.676 m), weight 210 lb (95.255 kg), last menstrual period 09/24/2011, SpO2 98.00%, unknown if currently breastfeeding.  Physical Exam:  General: alert Lochia: appropriate Uterine Fundus: firm Incision: Intact DVT Evaluation: No evidence of DVT seen on physical exam. Negative Homan's sign.   Basename 06/26/12 0545 06/25/12 0310  HGB 12.0 12.6  HCT 36.3 36.7    Assessment/Plan: S/P Vaginal delivery day 1 Continue current care Plan for discharge tomorrow Reviewed contraceptive options--patient will continue to consider options and decide by 6 weeks.   LOS: 1 day   Tyrie Porzio, Chip Boer 06/26/2012, 7:42 AM

## 2012-06-26 NOTE — Clinical Social Work Note (Signed)
CSW spoke with MOB briefly, hx was several years ago, no current diagnosis or concerns.    Patient was referred for history of depression/anxiety.  * Referral screened out by Clinical Social Worker because none of the following criteria appear to apply: ~ History of anxiety/depression during this pregnancy, or of post-partum depression. ~ Diagnosis of anxiety and/or depression within last 3 years ~ History of depression due to pregnancy loss/loss of child  OR  * Patient's symptoms currently being treated with medication and/or therapy.  Please contact the Clinical Social Worker if needs arise, or by the patient's request.  

## 2012-06-26 NOTE — Progress Notes (Signed)
Patient having moderate cramping, with benefit from Percocet Rx'd in hospital--has used Percocet pp with this pregnancy and with 2007 pregnancy. Hx opiod dependency, with treatment in 2004.  No issues since, even after using Percocet pp after last delivery. No issues with use of Percocet after last delivery. Previous hx reviewed with patient--advised patient we would not prescribe any additional Percocet beyond this Rx given today at d/c. Patient seems to understand these issues and is agreeable with our plan.  Nigel Bridgeman, CNM 06/27/11 2:40p

## 2012-06-26 NOTE — Discharge Summary (Signed)
  Vaginal Delivery Discharge Summary  [name] [name]  DOB:    [birth year] MRN:    [id number] CSN:    [id number]  Date of admission:                  06/25/12  Date of discharge:                   06/26/12  Procedures this admission: SVB  Newborn Data:  Live born female  Birth Weight: 7 lb 15 oz (3600 g) APGAR: 8, 10  Home with mother. Name: ?  History of Present Illness:  [name] is a 27 y.o. female, [id number], who presents at [redacted]w[redacted]d weeks gestation. The patient has been followed at the [facility] division [facility]. She was admitted onset of labor. Her pregnancy has been complicated by:  Patient Active Problem List  Diagnosis  . Pre-eclampsia, mild, third trimester  . Bipolar affective  . History of drug abuse  . Hx of preeclampsia, prior pregnancy, currently pregnant  . Significant discrepancy between uterine size and clinical dates, antepartum  . Anomaly of umbilical cord  . UTI (urinary tract infection) in pregnancy in third trimester  . NSVD (normal spontaneous vaginal delivery)     Hospital course: The patient was admitted for labor.   Her labor was not complicated. She proceeded to have a vaginal delivery of a healthy infant. Her delivery was not complicated. Her postpartum course was not complicated. She was discharged to home on postpartum day 1 doing well, per her request for early d/c.  Feeding:  breast and bottle  Contraception:  Undecided--will decide by 6 weeks.  Discharge hemoglobin:  Hemoglobin  Date Value Range Status  06/26/2012 12.0  12.0 - 15.0 g/dL Final     HCT  Date Value Range Status  06/26/2012 36.3  36.0 - 46.0 % Final    Discharge Physical Exam:   General: alert Lochia: appropriate Uterine Fundus: firm Incision: Intact DVT Evaluation: No evidence of DVT seen on physical exam. Negative Homan's sign.  Intrapartum Procedures: spontaneous vaginal delivery Postpartum  Procedures: none Complications-Operative and Postpartum: none  Discharge Diagnoses: Term Pregnancy-delivered  Discharge Information:  Activity:           Per [facility] handout Diet:                routine Medications: Ibuprofen Condition:      stable Instructions:  refer to practice specific booklet Discharge to: home  Follow-up Information    Follow up with [facility]. Schedule an appointment as soon as possible for a visit in 6 weeks. (Call for any questions or concerns)    Contact information:   [address] [place] [address] [phone]          Nigel Bridgeman 06/26/2012

## 2012-06-27 ENCOUNTER — Other Ambulatory Visit: Payer: Medicaid Other

## 2012-06-27 ENCOUNTER — Encounter: Payer: Medicaid Other | Admitting: Obstetrics and Gynecology

## 2012-06-27 NOTE — Progress Notes (Signed)
Post discharge chart review completed.  

## 2012-07-01 ENCOUNTER — Telehealth: Payer: Self-pay | Admitting: Obstetrics and Gynecology

## 2012-07-05 NOTE — Telephone Encounter (Signed)
Tc to pt per telephone call. Lm on vm to cb.

## 2012-07-13 NOTE — Telephone Encounter (Signed)
No response from pt. Chart to fb.  

## 2012-07-19 ENCOUNTER — Telehealth: Payer: Self-pay | Admitting: Obstetrics and Gynecology

## 2012-07-19 NOTE — Telephone Encounter (Signed)
TC from pt 3wks pp SVD c/o back pain, states she's had it since leaving hospital, has been taking motrin 600mg  and percocet but is now out of percocet. States pain is constant and is near where epidural was, lower mid back. Does not have any obvious swelling or bruising at the site. Denies any dysuria, denies numbness, tingling or weakness. Advised pt to take 600mg  motrin every 6hr ATC and 1000mg  tylenol every 4hr ATC for 24 hours, may try heating bad or ice pack, if no improvement overnight, call office first thing in AM for work-in appointment.

## 2012-07-20 ENCOUNTER — Telehealth: Payer: Self-pay | Admitting: Obstetrics and Gynecology

## 2012-07-20 ENCOUNTER — Ambulatory Visit: Payer: Medicaid Other | Admitting: Obstetrics and Gynecology

## 2012-07-20 ENCOUNTER — Encounter: Payer: Self-pay | Admitting: Obstetrics and Gynecology

## 2012-07-20 VITALS — BP 120/60 | Temp 97.6°F | Ht 66.0 in | Wt 200.0 lb

## 2012-07-20 DIAGNOSIS — M549 Dorsalgia, unspecified: Secondary | ICD-10-CM

## 2012-07-20 DIAGNOSIS — N898 Other specified noninflammatory disorders of vagina: Secondary | ICD-10-CM

## 2012-07-20 DIAGNOSIS — Z975 Presence of (intrauterine) contraceptive device: Secondary | ICD-10-CM

## 2012-07-20 LAB — POCT URINALYSIS DIPSTICK
Bilirubin, UA: NEGATIVE
Glucose, UA: NEGATIVE
Nitrite, UA: NEGATIVE
Urobilinogen, UA: NEGATIVE

## 2012-07-20 NOTE — Progress Notes (Addendum)
Rx's was called in for Motrin 800 mg 1 po q 8 hrs prn pain #40 1 refill.  Flexeril 10 mg 1 po q 8 hrs prn pain # 30 1 refill. Mathis Bud

## 2012-07-20 NOTE — Telephone Encounter (Signed)
Tc to pt.  Pt spoke with SL lasta PM regarding back pain.  Pt to office for eval by DR AVS.

## 2012-07-20 NOTE — Progress Notes (Signed)
HISTORY OF PRESENT ILLNESS  [name] is a 27 y.o. year old female,G3P2012, who presents for a problem visit. The patient had a vaginal delivery on June 25, 2012. The patient has a past history of substance abuse.  Subjective:  The patient says she has been sexually active.  She has had lower back pain that has become increasingly severe over the past 2 days.  Her back pain is worse with activity.  Objective:  BP 120/60  Temp 97.6 F (36.4 C)  Ht 5\' 6"  (1.676 m)  Wt 200 lb (90.719 kg)  BMI 32.28 kg/m2   General: no distress Resp: clear to auscultation bilaterally Cardio: regular rate and rhythm, S1, S2 normal, no murmur, click, rub or gallop GI: soft and nontender back: No CVA tenderness or upper back tenderness.  The patient complains of discomfort in her lower back particularly when she contracts her lower back muscles.  External genitalia: normal general appearance Vaginal: normal without tenderness, induration or masses Cervix: normal appearance Adnexa: normal bimanual exam Uterus: upper limits normal size  Urinalysis: Negative  Assessment:  Back pain believed to be due to muscle strain  Plan:  Motrin 800 mg every 8 hours as needed for pain  Flexeril 10 mg one tablet every 8 hours as needed for low back pain  Gonorrhea and Chlamydia sent.  The patient is interested in a Mirena IUD for contraception.  Return to office in 2 week(s).   Leonard Schwartz M.D.  07/20/2012 2:36 PM

## 2012-07-20 NOTE — Telephone Encounter (Signed)
Pt calling with pain from a muscle strain.  She wants oxycodone.  I told pt I will not be able to give her narcotics.  Pt will be referred to a Sport medicine doctor and PT

## 2012-07-21 ENCOUNTER — Telehealth: Payer: Self-pay | Admitting: Obstetrics and Gynecology

## 2012-07-21 ENCOUNTER — Encounter: Payer: Self-pay | Admitting: *Deleted

## 2012-07-21 DIAGNOSIS — M549 Dorsalgia, unspecified: Secondary | ICD-10-CM

## 2012-07-21 LAB — GC/CHLAMYDIA PROBE AMP
CT Probe RNA: NEGATIVE
GC Probe RNA: NEGATIVE

## 2012-07-21 NOTE — Telephone Encounter (Signed)
LM to return call.

## 2012-07-21 NOTE — Telephone Encounter (Signed)
Message copied by Mason Jim on Thu Jul 21, 2012  9:15 AM ------      Message from: Jaymes Graff      Created: Wed Jul 20, 2012  6:01 PM       Pt called for oxycodone for muscle strain.  Pt told we do not give narcotics for this.  Please refer to PT and sports medicine doctor who will take her insurance.  Thank you

## 2012-07-26 ENCOUNTER — Telehealth: Payer: Self-pay | Admitting: Obstetrics and Gynecology

## 2012-07-26 NOTE — Telephone Encounter (Signed)
TC to pt. LM to return call.  

## 2012-08-05 ENCOUNTER — Encounter: Payer: Medicaid Other | Admitting: Obstetrics and Gynecology

## 2012-08-15 ENCOUNTER — Ambulatory Visit: Payer: Medicaid Other | Admitting: Obstetrics and Gynecology

## 2012-08-15 ENCOUNTER — Encounter: Payer: Self-pay | Admitting: Obstetrics and Gynecology

## 2012-08-15 NOTE — Progress Notes (Signed)
Date of delivery: 06/25/2012 Female Name: [name] [name]  Vaginal delivery:yes Cesarean section:no Tubal ligation:no GDM:no Breast Feeding:yes Bottle Feeding:yes Post-Partum Blues:yes Abnormal pap:no  Normal GU function: yes Normal GI function:yes Returning to work:yes EPDS: 3 Pt stated she had unprotected intercourse a week ago and last night.   Subjective:     [name] [name] is a 27 y.o. female who presents for a postpartum visit.  I have fully reviewed the prenatal and intrapartum course. See information above. Her back pain is now better.   Patient is sexually active.   The following portions of the patient's history were reviewed and updated as appropriate: allergies, current medications, past family history, past medical history, past social history, past surgical history and problem list.  Review of Systems Pertinent items are noted in HPI.   Objective:    BP 102/58  Wt 193 lb (87.544 kg)  BMI 31.17 kg/m2  LMP 08/11/2012  Breastfeeding? Yes  General:  alert, cooperative and no distress           Abdomen: soft, non-tender; bowel sounds normal; no masses,  no organomegaly   Vulva:  normal  Vagina: normal vagina  Cervix:  normal  Corpus: normal size, contour, position, consistency, mobility, non-tender  Adnexa:  normal adnexa        Cesarean section incision well healed: n/a  Pregnancy test: Negative       Assessment:     Normal postpartum exam.  Pap smear not done at today's visit.   Plan:     1. Contraception: abstinence, condoms and Mirena IUD in 3 weeks. 2. EPDS: 6. 3. Follow up in: 3 weeks or as needed. 4. Breast Feeding: Yes. 5. Return to work, exercising, and a normal activities: yes.     Janine Limbo MD 08/15/2012 2:22 PM

## 2013-04-27 ENCOUNTER — Other Ambulatory Visit: Payer: Self-pay

## 2013-05-13 ENCOUNTER — Emergency Department (HOSPITAL_COMMUNITY)
Admission: EM | Admit: 2013-05-13 | Discharge: 2013-05-13 | Disposition: A | Discharge status: Home / Self Care | Payer: Medicaid Other | Attending: Emergency Medicine | Admitting: Emergency Medicine

## 2013-05-13 ENCOUNTER — Encounter (HOSPITAL_COMMUNITY): Payer: Self-pay | Admitting: Emergency Medicine

## 2013-05-13 DIAGNOSIS — Z3202 Encounter for pregnancy test, result negative: Secondary | ICD-10-CM | POA: Insufficient documentation

## 2013-05-13 DIAGNOSIS — J45909 Unspecified asthma, uncomplicated: Secondary | ICD-10-CM | POA: Insufficient documentation

## 2013-05-13 DIAGNOSIS — R6883 Chills (without fever): Secondary | ICD-10-CM | POA: Insufficient documentation

## 2013-05-13 DIAGNOSIS — Z87440 Personal history of urinary (tract) infections: Secondary | ICD-10-CM | POA: Insufficient documentation

## 2013-05-13 DIAGNOSIS — R42 Dizziness and giddiness: Secondary | ICD-10-CM | POA: Insufficient documentation

## 2013-05-13 DIAGNOSIS — Z8619 Personal history of other infectious and parasitic diseases: Secondary | ICD-10-CM | POA: Insufficient documentation

## 2013-05-13 DIAGNOSIS — A084 Viral intestinal infection, unspecified: Secondary | ICD-10-CM

## 2013-05-13 DIAGNOSIS — N189 Chronic kidney disease, unspecified: Secondary | ICD-10-CM | POA: Insufficient documentation

## 2013-05-13 DIAGNOSIS — Z880 Allergy status to penicillin: Secondary | ICD-10-CM | POA: Insufficient documentation

## 2013-05-13 DIAGNOSIS — Z79899 Other long term (current) drug therapy: Secondary | ICD-10-CM | POA: Insufficient documentation

## 2013-05-13 DIAGNOSIS — A088 Other specified intestinal infections: Secondary | ICD-10-CM | POA: Insufficient documentation

## 2013-05-13 DIAGNOSIS — R51 Headache: Secondary | ICD-10-CM | POA: Insufficient documentation

## 2013-05-13 DIAGNOSIS — Z87891 Personal history of nicotine dependence: Secondary | ICD-10-CM | POA: Insufficient documentation

## 2013-05-13 LAB — URINALYSIS, ROUTINE W REFLEX MICROSCOPIC
Ketones, ur: 15 mg/dL — AB
Nitrite: NEGATIVE
Protein, ur: NEGATIVE mg/dL
Urobilinogen, UA: 0.2 mg/dL (ref 0.0–1.0)

## 2013-05-13 LAB — CBC WITH DIFFERENTIAL/PLATELET
Basophils Relative: 0 % (ref 0–1)
Eosinophils Absolute: 0.1 10*3/uL (ref 0.0–0.7)
Eosinophils Relative: 1 % (ref 0–5)
Lymphs Abs: 0.8 10*3/uL (ref 0.7–4.0)
MCH: 32.1 pg (ref 26.0–34.0)
MCHC: 35.6 g/dL (ref 30.0–36.0)
MCV: 90.2 fL (ref 78.0–100.0)
Platelets: 151 10*3/uL (ref 150–400)
RBC: 4.7 MIL/uL (ref 3.87–5.11)
RDW: 12.5 % (ref 11.5–15.5)

## 2013-05-13 LAB — COMPREHENSIVE METABOLIC PANEL
ALT: 15 U/L (ref 0–35)
Albumin: 4.2 g/dL (ref 3.5–5.2)
Calcium: 9 mg/dL (ref 8.4–10.5)
GFR calc Af Amer: >90 mL/min (ref >90)
Glucose, Bld: 93 mg/dL (ref 70–99)
Sodium: 140 mEq/L (ref 135–145)
Total Protein: 7.4 g/dL (ref 6.0–8.3)

## 2013-05-13 LAB — URINE MICROSCOPIC-ADD ON

## 2013-05-13 MED ORDER — KETOROLAC TROMETHAMINE 30 MG/ML IJ SOLN
30.0000 mg | Freq: Once | INTRAMUSCULAR | Status: AC
  Administered 2013-05-13: 30 mg via INTRAVENOUS
  Filled 2013-05-13: qty 1

## 2013-05-13 MED ORDER — ONDANSETRON 4 MG PO TBDP: 8.0000 mg | ORAL_TABLET | Freq: Once | ORAL | Status: DC

## 2013-05-13 MED ORDER — ONDANSETRON HCL 4 MG/2ML IJ SOLN
4.0000 mg | Freq: Once | INTRAMUSCULAR | Status: AC
  Administered 2013-05-13: 4 mg via INTRAVENOUS

## 2013-05-13 MED ORDER — SODIUM CHLORIDE 0.9 % IV BOLUS (SEPSIS)
1000.0000 mL | Freq: Once | INTRAVENOUS | Status: AC
  Administered 2013-05-13: 1000 mL via INTRAVENOUS

## 2013-05-13 MED ORDER — ONDANSETRON 4 MG PO TBDP
8.0000 mg | ORAL_TABLET | Freq: Once | ORAL | Status: AC
  Administered 2013-05-13: 8 mg via ORAL
  Filled 2013-05-13: qty 2

## 2013-05-13 MED ORDER — ONDANSETRON 4 MG PO TBDP: ORAL_TABLET | ORAL | Status: DC

## 2013-05-13 NOTE — ED Provider Notes (Signed)
CSN: [id number]     Arrival date & time 05/13/13  0407 History   First MD Initiated Contact with Patient 05/13/13 0425     Chief Complaint  Patient presents with  . Emesis   (Consider location/radiation/quality/duration/timing/severity/associated sxs/prior Treatment) HPI Patient presents with multiple episodes of vomiting starting this evening around 6 PM. She states she has a son who had a similar illness 2 days ago. She's had 2 episodes of loose stools. She denies any blood in the vomit. She admits to dizziness this evening described as room spinning which is now resolved. She had a mild headache earlier which is also resolved. Her nausea is improved with Zofran. She denies any abdominal pain but reports increased bowel sounds. She's had no fever but complains of chills. Denies any urinary symptoms or vaginal symptoms. Past Medical History  Diagnosis Date  . Pregnancy induced hypertension   . Asthma     ALLERGIES;IF GETS UPSET; TRIGGERS ASTHMA  . Infection     YEAST INF;NOT FREQ  . Infection     UTI;CURRENTLY TAKING MACROBID FOR TX OF UTI  . Chronic kidney disease     KIDNEY INF;WAS HOSPITALIZED X 3 DAYS   Past Surgical History  Procedure Laterality Date  . Wisdom tooth extraction     Family History  Problem Relation Age of Onset  . Anesthesia problems Neg Hx   . Hypertension Father     DECEASED  . Diabetes Father     IDDM  . Seizures Brother   . Seizures Cousin     MAT 1ST COUSIN  . Diabetes Sister     HALF SISTER;ORAL MEDS  . Kidney disease Maternal Grandmother     DIALYSIS  . Asthma Brother     2 BROTHERS  . Asthma Mother   . Asthma Sister     HALF SISTER  . GER disease Mother   . Alcohol abuse Father    History  Substance Use Topics  . Smoking status: Former Smoker -- 0.20 packs/day for 4 years    Types: Cigarettes    Quit date: 09/17/2011  . Smokeless tobacco: Never Used  . Alcohol Use: No   OB History   Grav Para Term Preterm Abortions TAB SAB Ect  Mult Living   3 2 2  1  1   2      Review of Systems  Constitutional: Positive for chills. Negative for fever.  Respiratory: Negative for shortness of breath.   Cardiovascular: Negative for chest pain.  Gastrointestinal: Positive for nausea, vomiting and diarrhea. Negative for abdominal pain, constipation, blood in stool and abdominal distention.  Genitourinary: Negative for dysuria, frequency, hematuria, flank pain and difficulty urinating.  Musculoskeletal: Negative for back pain and myalgias.  Skin: Negative for rash and wound.  Neurological: Positive for dizziness and headaches. Negative for syncope, weakness and numbness.  All other systems reviewed and are negative.    Allergies  Penicillins  Home Medications   Current Outpatient Rx  Name  Route  Sig  Dispense  Refill  . acetaminophen (TYLENOL) 325 MG tablet   Oral   Take 325 mg by mouth every 6 (six) hours as needed. For pain         . EXPIRED: albuterol (PROVENTIL HFA;VENTOLIN HFA) 108 (90 BASE) MCG/ACT inhaler   Inhalation   Inhale 1-2 puffs into the lungs every 6 (six) hours as needed for wheezing.   1 Inhaler   1   . calcium carbonate (TUMS - DOSED IN MG ELEMENTAL  CALCIUM) 500 MG chewable tablet   Oral   Chew 1 tablet by mouth daily.         Marland Kitchen ibuprofen (ADVIL,MOTRIN) 600 MG tablet   Oral   Take 1 tablet (600 mg total) by mouth every 6 (six) hours as needed for pain.   36 tablet   2   . oxyCODONE-acetaminophen (ROXICET) 5-325 MG per tablet   Oral   Take 1 tablet by mouth every 4 (four) hours as needed for pain.   30 tablet   0   . Prenatal Vit-Fe Fumarate-FA (PRENATAL MULTIVITAMIN) TABS   Oral   Take 1 tablet by mouth daily.          BP 130/68  Pulse 113  Temp(Src) 98.4 F (36.9 C) (Oral)  Resp 18  SpO2 97% Physical Exam  Nursing note and vitals reviewed. Constitutional: She is oriented to person, place, and time. She appears well-developed and well-nourished. No distress.  HENT:  Head:  Normocephalic and atraumatic.  Mouth/Throat: Oropharynx is clear and moist.  Eyes: EOM are normal. Pupils are equal, round, and reactive to light.  Neck: Normal range of motion. Neck supple.  Cardiovascular: Normal rate and regular rhythm.   Pulmonary/Chest: Effort normal and breath sounds normal. No respiratory distress. She has no wheezes. She has no rales.  Abdominal: Soft. She exhibits no distension and no mass. There is no tenderness. There is no rebound and no guarding.  Increased bowel sounds.  Musculoskeletal: Normal range of motion. She exhibits no edema and no tenderness.  Neurological: She is alert and oriented to person, place, and time.  Moves all extremities without deficit. Sensation grossly intact.  Skin: Skin is warm and dry. No rash noted. No erythema.  Psychiatric: She has a normal mood and affect. Her behavior is normal.    ED Course  Procedures (including critical care time) Labs Review Labs Reviewed  URINALYSIS, ROUTINE W REFLEX MICROSCOPIC  PREGNANCY, URINE  CBC WITH DIFFERENTIAL  COMPREHENSIVE METABOLIC PANEL   Imaging Review No results found.  EKG Interpretation   None       MDM  We'll check electrolytes and give IV fluids. Like you leave viral illness. Anticipate discharge.  Patient is feeling much better after IV fluids and antiemetics. Patient tolerated liquids. We'll discharge home with by mouth antiemetics. Return precautions given.  Loren Racer, MD 05/13/13 [phone]

## 2013-05-13 NOTE — ED Notes (Signed)
Dr. Yelverton at BS 

## 2013-05-13 NOTE — ED Notes (Signed)
Ginger ale given as PO challenge. 

## 2013-05-13 NOTE — ED Notes (Signed)
Pt ambulatory to b/r for urine sample, pt into gown, steady gait.

## 2013-05-13 NOTE — ED Notes (Signed)
The pt has had  nv and diarrhea for 24hrs with a headache chills.  lmp unknown.  Her son was ill with the same yesterday.  Unable to hold anything on her stomach

## 2013-05-14 LAB — URINE CULTURE
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
## 2013-07-07 ENCOUNTER — Encounter (HOSPITAL_COMMUNITY): Payer: Self-pay | Admitting: *Deleted

## 2013-07-07 ENCOUNTER — Inpatient Hospital Stay (HOSPITAL_COMMUNITY): Payer: Medicaid Other

## 2013-07-07 ENCOUNTER — Inpatient Hospital Stay (HOSPITAL_COMMUNITY)
Admission: AD | Admit: 2013-07-07 | Discharge: 2013-07-07 | Disposition: A | Discharge status: Home / Self Care | Payer: Medicaid Other | Source: Ambulatory Visit | Attending: Obstetrics and Gynecology | Admitting: Obstetrics and Gynecology

## 2013-07-07 DIAGNOSIS — D696 Thrombocytopenia, unspecified: Secondary | ICD-10-CM

## 2013-07-07 DIAGNOSIS — O99119 Other diseases of the blood and blood-forming organs and certain disorders involving the immune mechanism complicating pregnancy, unspecified trimester: Secondary | ICD-10-CM

## 2013-07-07 DIAGNOSIS — IMO0001 Reserved for inherently not codable concepts without codable children: Secondary | ICD-10-CM | POA: Insufficient documentation

## 2013-07-07 DIAGNOSIS — Z8759 Personal history of other complications of pregnancy, childbirth and the puerperium: Secondary | ICD-10-CM

## 2013-07-07 DIAGNOSIS — O09299 Supervision of pregnancy with other poor reproductive or obstetric history, unspecified trimester: Secondary | ICD-10-CM

## 2013-07-07 DIAGNOSIS — O209 Hemorrhage in early pregnancy, unspecified: Secondary | ICD-10-CM | POA: Insufficient documentation

## 2013-07-07 DIAGNOSIS — D689 Coagulation defect, unspecified: Secondary | ICD-10-CM | POA: Insufficient documentation

## 2013-07-07 DIAGNOSIS — Z87440 Personal history of urinary (tract) infections: Secondary | ICD-10-CM

## 2013-07-07 LAB — CBC
HCT: 35.8 % — ABNORMAL LOW (ref 36.0–46.0)
HEMOGLOBIN: 12.4 g/dL (ref 12.0–15.0)
MCH: 30.8 pg (ref 26.0–34.0)
MCHC: 34.6 g/dL (ref 30.0–36.0)
MCV: 89.1 fL (ref 78.0–100.0)
Platelets: 149 10*3/uL — ABNORMAL LOW (ref 150–400)
RBC: 4.02 MIL/uL (ref 3.87–5.11)
RDW: 12.3 % (ref 11.5–15.5)
WBC: 5.4 10*3/uL (ref 4.0–10.5)

## 2013-07-07 LAB — HCG, QUANTITATIVE, PREGNANCY: HCG, BETA CHAIN, QUANT, S: 87020 m[IU]/mL — AB (ref <5)

## 2013-07-07 NOTE — Discharge Instructions (Signed)
Vaginal Bleeding During Pregnancy, First Trimester °A small amount of bleeding (spotting) from the vagina is relatively common in early pregnancy. It usually stops on its own. Various things may cause bleeding or spotting in early pregnancy. Some bleeding may be related to the pregnancy, and some may not. In most cases, the bleeding is normal and is not a problem. However, bleeding can also be a sign of something serious. Be sure to tell your health care provider about any vaginal bleeding right away. °Some possible causes of vaginal bleeding during the first trimester include: °· Infection or inflammation of the cervix. °· Growths (polyps) on the cervix. °· Miscarriage or threatened miscarriage. °· Pregnancy tissue has developed outside of the uterus and in a fallopian tube (tubal pregnancy). °· Tiny cysts have developed in the uterus instead of pregnancy tissue (molar pregnancy). °HOME CARE INSTRUCTIONS  °Watch your condition for any changes. The following actions may help to lessen any discomfort you are feeling: °· Follow your health care provider's instructions for limiting your activity. If your health care provider orders bed rest, you may need to stay in bed and only get up to use the bathroom. However, your health care provider may allow you to continue light activity. °· If needed, make plans for someone to help with your regular activities and responsibilities while you are on bed rest. °· Keep track of the number of pads you use each day, how often you change pads, and how soaked (saturated) they are. Write this down. °· Do not use tampons. Do not douche. °· Do not have sexual intercourse or orgasms until approved by your health care provider. °· If you pass any tissue from your vagina, save the tissue so you can show it to your health care provider. °· Only take over-the-counter or prescription medicines as directed by your health care provider. °· Do not take aspirin because it can make you  bleed. °· Keep all follow-up appointments as directed by your health care provider. °SEEK MEDICAL CARE IF: °· You have any vaginal bleeding during any part of your pregnancy. °· You have cramps or labor pains. °SEEK IMMEDIATE MEDICAL CARE IF:  °· You have severe cramps in your back or belly (abdomen). °· You have a fever, not controlled by medicine. °· You pass large clots or tissue from your vagina. °· Your bleeding increases. °· You feel lightheaded or weak, or you have fainting episodes. °· You have chills. °· You are leaking fluid or have a gush of fluid from your vagina. °· You pass out while having a bowel movement. °MAKE SURE YOU: °· Understand these instructions. °· Will watch your condition. °· Will get help right away if you are not doing well or get worse. °Document Released: 03/18/2005 Document Revised: 03/29/2013 Document Reviewed: 02/13/2013 °[employer]® Patient Information ©2014 [employer]. ° °

## 2013-07-07 NOTE — MAU Note (Signed)
Started bleeding this morning. Reddish brown, small gush.cramping and pressure this morning.

## 2013-07-07 NOTE — MAU Note (Signed)
Pt presents to MAU with c/o reddish, brownish blood seen today. Pt states she felt a gush and noticed the blood on the toilet paper.

## 2013-07-07 NOTE — MAU Provider Note (Signed)
History   28 yo G4P2012 at 8 weeks by LMP presented unannounced c/o vaginal bleeding today.  Denies pain, no recent IC.  Hx remarkable for pregnancy while thought to be in place, but IUD not seen on US done with + UPT very early in pregnancy.    Patient Active Problem List   Diagnosis Date Noted  . Hx of pre-eclampsia in prior pregnancy, currently pregnant 07/08/2013  . 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 Bipolar affective 01/06/2012  . History of drug abuse 01/06/2012     Chief Complaint  Patient presents with  . Vaginal Bleeding  . Abdominal Cramping   HPI:  See above  OB History   Grav Para Term Preterm Abortions TAB SAB Ect Mult Living   4 2 2  1  1   2       Past Medical History  Diagnosis Date  . Pregnancy induced hypertension   . Asthma     ALLERGIES;IF GETS UPSET; TRIGGERS ASTHMA  . Infection     YEAST INF;NOT FREQ  . Infection     UTI;CURRENTLY TAKING MACROBID FOR TX OF UTI  . Chronic kidney disease     KIDNEY INF;WAS HOSPITALIZED X 3 DAYS    Past Surgical History  Procedure Laterality Date  . Wisdom tooth extraction      Family History  Problem Relation Age of Onset  . Anesthesia problems Neg Hx   . Hypertension Father     DECEASED  . Diabetes Father     IDDM  . Seizures Brother   . Seizures Cousin     MAT 1ST COUSIN  . Diabetes Sister     HALF SISTER;ORAL MEDS  . Kidney disease Maternal Grandmother     DIALYSIS  . Asthma Brother     2 BROTHERS  . Asthma Mother   . Asthma Sister     HALF SISTER  . GER disease Mother   . Alcohol abuse Father     History  Substance Use Topics  . Smoking status: Former Smoker -- 0.20 packs/day for 4 years    Types: Cigarettes    Quit date: 09/17/2011  . Smokeless tobacco: Never Used  . Alcohol Use: No    Allergies:  Allergies  Allergen Reactions  . Penicillins Rash    Has taken amoxicillin without difficulty    No prescriptions prior to admission    ROS:  Vaginal  bleeding, light red/brown  Results for orders placed during the hospital encounter of 07/07/13 (from the past 24 hour(s))  HCG, QUANTITATIVE, PREGNANCY     Status: Abnormal   Collection Time    07/07/13 12:35 PM      Result Value Range   hCG, Beta Francene Finders [address] (*) <5 mIU/mL  CBC     Status: Abnormal   Collection Time    07/07/13 12:35 PM      Result Value Range   WBC 5.4  4.0 - 10.5 K/uL   RBC 4.02  3.87 - 5.11 MIL/uL   Hemoglobin 12.4  12.0 - 15.0 g/dL   HCT 60.4 (*) 54.0 - 98.1 %   MCV 89.1  78.0 - 100.0 fL   MCH 30.8  26.0 - 34.0 pg   MCHC 34.6  30.0 - 36.0 g/dL   RDW 19.1  47.8 - 29.5 %   Platelets 149 (*) 150 - 400 K/uL  GC/CHLAMYDIA PROBE AMP     Status: None   Collection Time    07/07/13  3:11 PM      Result Value Range   CT Probe RNA NEGATIVE  NEGATIVE   GC Probe RNA NEGATIVE  NEGATIVE     US: IMPRESSION:  Single living IUP measuring 8 weeks 3 days with US EDC of  02/13/2014.  No significant maternal uterine or adnexal abnormality identified.   Blood pressure 111/70, pulse 82, temperature 98.1 F (36.7 C), temperature source Oral, resp. rate 18, height 5\' 6"  (1.676 m), weight 191 lb (86.637 kg), last menstrual period 08/11/2012, SpO2 100.00%.  Physical Exam  Physical Exam  Chest clear Heart RRR without murmur Abd soft, NT Pelvic--small amount brown d/c in vault, no active bleeding, cervix closed, long, firm Ext WNL   ED Course  IUP at 8 3/7 weeks by US 1st trimester bleeding O+ Mild thrombocytopenia  Plan: D/C home with bleeding precautions. F/u as scheduled at NOB interview in 1-2 weeks. Pelvic rest--note given reflecting this per patient request. Recheck CBC at NOB interview and prn.   Nigel BridgemanLATHAM, Onesti Bonfiglio CNM, [place] 07/08/2013 9:09 AM

## 2013-07-08 DIAGNOSIS — O09299 Supervision of pregnancy with other poor reproductive or obstetric history, unspecified trimester: Secondary | ICD-10-CM

## 2013-07-08 DIAGNOSIS — Z87440 Personal history of urinary (tract) infections: Secondary | ICD-10-CM

## 2013-07-08 DIAGNOSIS — D696 Thrombocytopenia, unspecified: Secondary | ICD-10-CM

## 2013-07-08 DIAGNOSIS — Z8759 Personal history of other complications of pregnancy, childbirth and the puerperium: Secondary | ICD-10-CM

## 2013-07-08 HISTORY — DX: Personal history of urinary (tract) infections: Z87.440

## 2013-07-08 HISTORY — DX: Supervision of pregnancy with other poor reproductive or obstetric history, unspecified trimester: O09.299

## 2013-07-08 LAB — GC/CHLAMYDIA PROBE AMP
CT PROBE, AMP APTIMA: NEGATIVE
GC PROBE AMP APTIMA: NEGATIVE

## 2013-11-01 ENCOUNTER — Inpatient Hospital Stay (HOSPITAL_COMMUNITY)
Admission: AD | Admit: 2013-11-01 | Discharge: 2013-11-01 | Disposition: A | Discharge status: Home / Self Care | Payer: Medicaid Other | Source: Ambulatory Visit | Attending: Obstetrics and Gynecology | Admitting: Obstetrics and Gynecology

## 2013-11-01 ENCOUNTER — Inpatient Hospital Stay (HOSPITAL_COMMUNITY): Payer: Medicaid Other

## 2013-11-01 ENCOUNTER — Encounter (HOSPITAL_COMMUNITY): Payer: Self-pay

## 2013-11-01 DIAGNOSIS — Z87891 Personal history of nicotine dependence: Secondary | ICD-10-CM | POA: Insufficient documentation

## 2013-11-01 DIAGNOSIS — O99891 Other specified diseases and conditions complicating pregnancy: Secondary | ICD-10-CM | POA: Insufficient documentation

## 2013-11-01 DIAGNOSIS — Y92410 Unspecified street and highway as the place of occurrence of the external cause: Secondary | ICD-10-CM | POA: Insufficient documentation

## 2013-11-01 DIAGNOSIS — N189 Chronic kidney disease, unspecified: Secondary | ICD-10-CM | POA: Diagnosis not present

## 2013-11-01 DIAGNOSIS — O9989 Other specified diseases and conditions complicating pregnancy, childbirth and the puerperium: Principal | ICD-10-CM

## 2013-11-01 DIAGNOSIS — O26839 Pregnancy related renal disease, unspecified trimester: Secondary | ICD-10-CM | POA: Diagnosis not present

## 2013-11-01 LAB — CBC
HCT: 33.1 % — ABNORMAL LOW (ref 36.0–46.0)
HEMOGLOBIN: 11.4 g/dL — AB (ref 12.0–15.0)
MCH: 31.7 pg (ref 26.0–34.0)
MCHC: 34.4 g/dL (ref 30.0–36.0)
MCV: 91.9 fL (ref 78.0–100.0)
Platelets: 148 10*3/uL — ABNORMAL LOW (ref 150–400)
RBC: 3.6 MIL/uL — AB (ref 3.87–5.11)
RDW: 13 % (ref 11.5–15.5)
WBC: 7.4 10*3/uL (ref 4.0–10.5)

## 2013-11-01 LAB — WET PREP, GENITAL
Clue Cells Wet Prep HPF POC: NONE SEEN
Trich, Wet Prep: NONE SEEN
YEAST WET PREP: NONE SEEN

## 2013-11-01 LAB — POCT FERN TEST: POCT Fern Test: NEGATIVE — NL
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
## 2013-11-01 NOTE — Progress Notes (Signed)
Notified of pt request to go home and preliminary result of ultrasound. Received discharge orders
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
## 2013-12-28 ENCOUNTER — Inpatient Hospital Stay (HOSPITAL_COMMUNITY)
Admission: AD | Admit: 2013-12-28 | Discharge: 2013-12-28 | Disposition: A | Discharge status: Home / Self Care | Payer: Medicaid Other | Source: Ambulatory Visit | Attending: Obstetrics and Gynecology | Admitting: Obstetrics and Gynecology

## 2013-12-28 ENCOUNTER — Encounter (HOSPITAL_COMMUNITY): Payer: Self-pay | Admitting: *Deleted

## 2013-12-28 DIAGNOSIS — O26839 Pregnancy related renal disease, unspecified trimester: Secondary | ICD-10-CM | POA: Insufficient documentation

## 2013-12-28 DIAGNOSIS — O99891 Other specified diseases and conditions complicating pregnancy: Secondary | ICD-10-CM | POA: Insufficient documentation

## 2013-12-28 DIAGNOSIS — R110 Nausea: Secondary | ICD-10-CM | POA: Insufficient documentation

## 2013-12-28 DIAGNOSIS — O9989 Other specified diseases and conditions complicating pregnancy, childbirth and the puerperium: Principal | ICD-10-CM

## 2013-12-28 DIAGNOSIS — O212 Late vomiting of pregnancy: Secondary | ICD-10-CM | POA: Insufficient documentation

## 2013-12-28 DIAGNOSIS — Z87898 Personal history of other specified conditions: Secondary | ICD-10-CM | POA: Insufficient documentation

## 2013-12-28 DIAGNOSIS — O26899 Other specified pregnancy related conditions, unspecified trimester: Secondary | ICD-10-CM

## 2013-12-28 DIAGNOSIS — T8331XA Breakdown (mechanical) of intrauterine contraceptive device, initial encounter: Secondary | ICD-10-CM | POA: Insufficient documentation

## 2013-12-28 DIAGNOSIS — R102 Pelvic and perineal pain: Secondary | ICD-10-CM

## 2013-12-28 DIAGNOSIS — N949 Unspecified condition associated with female genital organs and menstrual cycle: Secondary | ICD-10-CM | POA: Diagnosis not present

## 2013-12-28 DIAGNOSIS — R1031 Right lower quadrant pain: Secondary | ICD-10-CM | POA: Insufficient documentation

## 2013-12-28 HISTORY — DX: Personal history of other specified conditions: Z87.898

## 2013-12-28 LAB — URINALYSIS, ROUTINE W REFLEX MICROSCOPIC
Bilirubin Urine: NEGATIVE
Glucose, UA: NEGATIVE mg/dL
Hgb urine dipstick: NEGATIVE
Ketones, ur: 15 mg/dL — AB
LEUKOCYTES UA: NEGATIVE
Nitrite: NEGATIVE
PH: 7 (ref 5.0–8.0)
Protein, ur: NEGATIVE mg/dL
SPECIFIC GRAVITY, URINE: 1.01 (ref 1.005–1.030)
UROBILINOGEN UA: 0.2 mg/dL (ref 0.0–1.0)

## 2013-12-28 MED ORDER — ACETAMINOPHEN 325 MG PO TABS: 650.0000 mg | ORAL_TABLET | Freq: Four times a day (QID) | ORAL | Status: DC | PRN

## 2013-12-28 MED ORDER — CONCEPT DHA 53.5-38-1 MG PO CAPS: 1.0000 | ORAL_CAPSULE | Freq: Every day | ORAL | Status: DC

## 2013-12-28 MED ORDER — ONDANSETRON 4 MG PO TBDP: 4.0000 mg | ORAL_TABLET | Freq: Three times a day (TID) | ORAL | Status: DC | PRN

## 2013-12-28 MED ORDER — ONDANSETRON 4 MG PO TBDP
4.0000 mg | ORAL_TABLET | Freq: Once | ORAL | Status: AC
  Administered 2013-12-28: 4 mg via ORAL
  Filled 2013-12-28: qty 1

## 2013-12-28 MED ORDER — ACETAMINOPHEN 325 MG PO TABS
650.0000 mg | ORAL_TABLET | Freq: Once | ORAL | Status: AC
  Administered 2013-12-28: 650 mg via ORAL
  Filled 2013-12-28: qty 2

## 2013-12-28 NOTE — MAU Note (Signed)
Urine in lab 

## 2013-12-28 NOTE — Discharge Instructions (Signed)
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
## 2013-12-28 NOTE — MAU Note (Signed)
Pt stated she started having sharp LLQ pain last night. Had been moving and helping with lifting boxes etc. Pain started last night hurts with walking or changing position.  Pt stated it feels like ligament pain that she has had in the past

## 2014-02-11 ENCOUNTER — Inpatient Hospital Stay (HOSPITAL_COMMUNITY)
Admission: AD | Admit: 2014-02-11 | Discharge: 2014-02-13 | DRG: 775 | Disposition: A | Discharge status: Home / Self Care | Payer: Medicaid Other | Source: Ambulatory Visit | Attending: Obstetrics & Gynecology | Admitting: Obstetrics & Gynecology

## 2014-02-11 ENCOUNTER — Inpatient Hospital Stay (HOSPITAL_COMMUNITY): Payer: Medicaid Other | Admitting: Anesthesiology

## 2014-02-11 ENCOUNTER — Encounter (HOSPITAL_COMMUNITY): Payer: Self-pay

## 2014-02-11 ENCOUNTER — Encounter (HOSPITAL_COMMUNITY): Payer: Medicaid Other | Admitting: Anesthesiology

## 2014-02-11 DIAGNOSIS — Z833 Family history of diabetes mellitus: Secondary | ICD-10-CM

## 2014-02-11 DIAGNOSIS — J45909 Unspecified asthma, uncomplicated: Secondary | ICD-10-CM | POA: Diagnosis present

## 2014-02-11 DIAGNOSIS — Z880 Allergy status to penicillin: Secondary | ICD-10-CM

## 2014-02-11 DIAGNOSIS — O479 False labor, unspecified: Secondary | ICD-10-CM | POA: Diagnosis present

## 2014-02-11 DIAGNOSIS — Z8249 Family history of ischemic heart disease and other diseases of the circulatory system: Secondary | ICD-10-CM | POA: Diagnosis not present

## 2014-02-11 DIAGNOSIS — F988 Other specified behavioral and emotional disorders with onset usually occurring in childhood and adolescence: Secondary | ICD-10-CM | POA: Diagnosis present

## 2014-02-11 DIAGNOSIS — O9989 Other specified diseases and conditions complicating pregnancy, childbirth and the puerperium: Secondary | ICD-10-CM

## 2014-02-11 DIAGNOSIS — Z87898 Personal history of other specified conditions: Secondary | ICD-10-CM

## 2014-02-11 DIAGNOSIS — O99344 Other mental disorders complicating childbirth: Secondary | ICD-10-CM | POA: Diagnosis present

## 2014-02-11 DIAGNOSIS — Z825 Family history of asthma and other chronic lower respiratory diseases: Secondary | ICD-10-CM | POA: Diagnosis not present

## 2014-02-11 DIAGNOSIS — Z87891 Personal history of nicotine dependence: Secondary | ICD-10-CM

## 2014-02-11 DIAGNOSIS — T8331XA Breakdown (mechanical) of intrauterine contraceptive device, initial encounter: Secondary | ICD-10-CM

## 2014-02-11 DIAGNOSIS — Z331 Pregnant state, incidental: Secondary | ICD-10-CM

## 2014-02-11 LAB — CBC
HCT: 34.2 % — ABNORMAL LOW (ref 36.0–46.0)
Hemoglobin: 12.1 g/dL (ref 12.0–15.0)
MCH: 31.8 pg (ref 26.0–34.0)
MCHC: 35.4 g/dL (ref 30.0–36.0)
MCV: 90 fL (ref 78.0–100.0)
PLATELETS: 151 10*3/uL (ref 150–400)
RBC: 3.8 MIL/uL — AB (ref 3.87–5.11)
RDW: 13.2 % (ref 11.5–15.5)
WBC: 9.2 10*3/uL (ref 4.0–10.5)

## 2014-02-11 LAB — RPR

## 2014-02-11 MED ORDER — FLEET ENEMA 7-19 GM/118ML RE ENEM: 1.0000 | ENEMA | RECTAL | Status: DC | PRN

## 2014-02-11 MED ORDER — OXYTOCIN BOLUS FROM INFUSION
500.0000 mL | INTRAVENOUS | Status: DC
  Administered 2014-02-11: 500 mL via INTRAVENOUS

## 2014-02-11 MED ORDER — LIDOCAINE HCL (PF) 1 % IJ SOLN
30.0000 mL | INTRAMUSCULAR | Status: DC | PRN
  Filled 2014-02-11: qty 30

## 2014-02-11 MED ORDER — DIBUCAINE 1 % RE OINT: 1.0000 "application " | TOPICAL_OINTMENT | RECTAL | Status: DC | PRN

## 2014-02-11 MED ORDER — LIDOCAINE HCL (PF) 1 % IJ SOLN
INTRAMUSCULAR | Status: DC | PRN
  Administered 2014-02-11 (×4): 4 mL

## 2014-02-11 MED ORDER — TETANUS-DIPHTH-ACELL PERTUSSIS 5-2.5-18.5 LF-MCG/0.5 IM SUSP
0.5000 mL | Freq: Once | INTRAMUSCULAR | Status: AC
  Administered 2014-02-13: 0.5 mL via INTRAMUSCULAR

## 2014-02-11 MED ORDER — ONDANSETRON HCL 4 MG/2ML IJ SOLN
4.0000 mg | Freq: Four times a day (QID) | INTRAMUSCULAR | Status: DC | PRN
  Administered 2014-02-11: 4 mg via INTRAVENOUS
  Filled 2014-02-11: qty 2

## 2014-02-11 MED ORDER — PHENYLEPHRINE 40 MCG/ML (10ML) SYRINGE FOR IV PUSH (FOR BLOOD PRESSURE SUPPORT)
80.0000 ug | PREFILLED_SYRINGE | INTRAVENOUS | Status: DC | PRN
  Filled 2014-02-11: qty 2
  Filled 2014-02-11: qty 10

## 2014-02-11 MED ORDER — IBUPROFEN 600 MG PO TABS
600.0000 mg | ORAL_TABLET | Freq: Four times a day (QID) | ORAL | Status: DC
  Administered 2014-02-12 – 2014-02-13 (×7): 600 mg via ORAL
  Filled 2014-02-11 (×7): qty 1

## 2014-02-11 MED ORDER — LACTATED RINGERS IV SOLN
500.0000 mL | Freq: Once | INTRAVENOUS | Status: AC
Start: 2014-02-11 — End: 2014-02-11
  Administered 2014-02-11: 500 mL via INTRAVENOUS

## 2014-02-11 MED ORDER — OXYCODONE-ACETAMINOPHEN 5-325 MG PO TABS
1.0000 | ORAL_TABLET | ORAL | Status: DC | PRN
  Administered 2014-02-12 (×5): 1 via ORAL
  Administered 2014-02-13: 2 via ORAL
  Filled 2014-02-11: qty 1
  Filled 2014-02-11: qty 2
  Filled 2014-02-11 (×4): qty 1

## 2014-02-11 MED ORDER — FENTANYL 2.5 MCG/ML BUPIVACAINE 1/10 % EPIDURAL INFUSION (WH - ANES)
14.0000 mL/h | INTRAMUSCULAR | Status: DC | PRN
  Administered 2014-02-11: 14 mL/h via EPIDURAL
  Filled 2014-02-11: qty 125

## 2014-02-11 MED ORDER — BENZOCAINE-MENTHOL 20-0.5 % EX AERO
1.0000 "application " | INHALATION_SPRAY | CUTANEOUS | Status: DC | PRN
  Administered 2014-02-11: 1 via TOPICAL
  Filled 2014-02-11: qty 56

## 2014-02-11 MED ORDER — LACTATED RINGERS IV SOLN
INTRAVENOUS | Status: DC
  Administered 2014-02-11 (×2): via INTRAVENOUS

## 2014-02-11 MED ORDER — PRENATAL MULTIVITAMIN CH
1.0000 | ORAL_TABLET | Freq: Every day | ORAL | Status: DC
  Administered 2014-02-12 – 2014-02-13 (×2): 1 via ORAL
  Filled 2014-02-11 (×2): qty 1

## 2014-02-11 MED ORDER — BUTORPHANOL TARTRATE 1 MG/ML IJ SOLN: 1.0000 mg | INTRAMUSCULAR | Status: DC | PRN

## 2014-02-11 MED ORDER — DIPHENHYDRAMINE HCL 25 MG PO CAPS
25.0000 mg | ORAL_CAPSULE | Freq: Four times a day (QID) | ORAL | Status: DC | PRN
  Administered 2014-02-11: 25 mg via ORAL
  Filled 2014-02-11: qty 1

## 2014-02-11 MED ORDER — LANOLIN HYDROUS EX OINT: TOPICAL_OINTMENT | CUTANEOUS | Status: DC | PRN

## 2014-02-11 MED ORDER — OXYCODONE-ACETAMINOPHEN 5-325 MG PO TABS: 1.0000 | ORAL_TABLET | ORAL | Status: DC | PRN

## 2014-02-11 MED ORDER — OXYTOCIN 40 UNITS IN LACTATED RINGERS INFUSION - SIMPLE MED
62.5000 mL/h | INTRAVENOUS | Status: DC
  Filled 2014-02-11: qty 1000

## 2014-02-11 MED ORDER — ONDANSETRON HCL 4 MG/2ML IJ SOLN: 4.0000 mg | INTRAMUSCULAR | Status: DC | PRN

## 2014-02-11 MED ORDER — WITCH HAZEL-GLYCERIN EX PADS: 1.0000 "application " | MEDICATED_PAD | CUTANEOUS | Status: DC | PRN

## 2014-02-11 MED ORDER — DIPHENHYDRAMINE HCL 50 MG/ML IJ SOLN: 12.5000 mg | INTRAMUSCULAR | Status: DC | PRN

## 2014-02-11 MED ORDER — EPHEDRINE 5 MG/ML INJ
10.0000 mg | INTRAVENOUS | Status: DC | PRN
  Filled 2014-02-11: qty 2

## 2014-02-11 MED ORDER — PHENYLEPHRINE 40 MCG/ML (10ML) SYRINGE FOR IV PUSH (FOR BLOOD PRESSURE SUPPORT)
80.0000 ug | PREFILLED_SYRINGE | INTRAVENOUS | Status: DC | PRN
  Filled 2014-02-11: qty 2

## 2014-02-11 MED ORDER — LACTATED RINGERS IV SOLN
500.0000 mL | INTRAVENOUS | Status: DC | PRN
  Administered 2014-02-11: 500 mL via INTRAVENOUS

## 2014-02-11 MED ORDER — ONDANSETRON HCL 4 MG PO TABS: 4.0000 mg | ORAL_TABLET | ORAL | Status: DC | PRN

## 2014-02-11 MED ORDER — ACETAMINOPHEN 325 MG PO TABS: 650.0000 mg | ORAL_TABLET | ORAL | Status: DC | PRN

## 2014-02-11 MED ORDER — IBUPROFEN 600 MG PO TABS: 600.0000 mg | ORAL_TABLET | Freq: Four times a day (QID) | ORAL | Status: DC | PRN

## 2014-02-11 MED ORDER — SIMETHICONE 80 MG PO CHEW
80.0000 mg | CHEWABLE_TABLET | ORAL | Status: DC | PRN
Start: 2014-02-11 — End: 2014-02-13

## 2014-02-11 MED ORDER — SENNOSIDES-DOCUSATE SODIUM 8.6-50 MG PO TABS
2.0000 | ORAL_TABLET | ORAL | Status: DC
  Administered 2014-02-12 (×2): 2 via ORAL
  Filled 2014-02-11 (×2): qty 2

## 2014-02-11 MED ORDER — CITRIC ACID-SODIUM CITRATE 334-500 MG/5ML PO SOLN: 30.0000 mL | ORAL | Status: DC | PRN

## 2014-02-11 MED ORDER — ZOLPIDEM TARTRATE 5 MG PO TABS: 5.0000 mg | ORAL_TABLET | Freq: Every evening | ORAL | Status: DC | PRN

## 2014-02-11 NOTE — Plan of Care (Signed)
Problem: Consults Goal: Birthing Suites Patient Information Press F2 to bring up selections list  Outcome: Completed/Met Date Met:  02/11/14  Pt 37-[redacted] weeks EGA
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
## 2014-02-11 NOTE — MAU Note (Signed)
Pt presents to MAU with complaints of contractions that started around 1110 today. Denies any vaginal bleeding or LOF.

## 2014-02-11 NOTE — Progress Notes (Signed)
  Subjective: Getting comfortable with epidural.  Had vasovagal episode during epidural placement--feeling fine now.  Objective: BP 112/63  Pulse 62  Temp(Src) 98.6 F (37 C) (Oral)  Resp 18  Ht  (1.676 m)  Wt 213 lb (96.616 kg)  BMI 34.40 kg/m2  SpO2 99%  LMP 08/11/2012      FHT: Category 1 UC:   regular, every 3-5 minutes SVE:   Dilation: 7 Effacement (%): 100 Station: 0 Exam by:: Emilee Hero, CNM BB[facility]--AROM, light MSF  Assessment:  Progressive labor MSF  Plan: Continue to observe for advancing labor. Augment prn.  Nigel Bridgeman CNM 02/11/2014, 4:21 PM

## 2014-02-11 NOTE — Anesthesia Procedure Notes (Signed)
Epidural Patient location during procedure: OB Start time: 02/11/2014 3:51 PM  Staffing Performed by: anesthesiologist   Preanesthetic Checklist Completed: patient identified, site marked, surgical consent, pre-op evaluation, timeout performed, IV checked, risks and benefits discussed and monitors and equipment checked  Epidural Patient position: sitting Prep: site prepped and draped and DuraPrep Patient monitoring: continuous pulse ox and blood pressure Approach: midline Injection technique: LOR air  Needle:  Needle type: Tuohy  Needle gauge: 17 G Needle length: 9 cm and 9 Needle insertion depth: 6.5 cm Catheter type: closed end flexible Catheter size: 19 Gauge Catheter at skin depth: 11.5 cm Test dose: negative  Assessment Events: blood not aspirated, injection not painful, no injection resistance, negative IV test and no paresthesia  Additional Notes Discussed risk of headache, infection, bleeding, nerve injury and failed or incomplete block.  Patient voices understanding and wishes to proceed.  Epidural placed easily on first attempt.  No paresthesia.  Immediately after test dose given, pt began hyperventilating and feeling syncopal.  Pulse dropped to 30s and BP to 80s systolic.  Epidural secured and patient placed in supine position with LUD.  Given bag to rebreathe.  BP rechecked and 115/55.  Pulse increased to 55.  Calming down, RR decreased.  Patient feeling much better. Contractions painful.  Test doses repeated - no problems.  Believe was vaso-vagal episode.  Comfortable after bolusing.  Jasmine December, MDReason for block:procedure for pain
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
## 2014-02-12 ENCOUNTER — Inpatient Hospital Stay (HOSPITAL_COMMUNITY): Payer: Medicaid Other

## 2014-02-12 LAB — CBC
HEMATOCRIT: 31.9 % — AB (ref 36.0–46.0)
Hemoglobin: 11.1 g/dL — ABNORMAL LOW (ref 12.0–15.0)
MCH: 31.4 pg (ref 26.0–34.0)
MCHC: 34.8 g/dL (ref 30.0–36.0)
MCV: 90.1 fL (ref 78.0–100.0)
PLATELETS: 144 10*3/uL — AB (ref 150–400)
RBC: 3.54 MIL/uL — ABNORMAL LOW (ref 3.87–5.11)
RDW: 13.3 % (ref 11.5–15.5)
WBC: 8.6 10*3/uL (ref 4.0–10.5)
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
## 2014-02-12 NOTE — Discharge Instructions (Signed)
Breastfeeding Deciding to breastfeed is one of the best choices you can make for you and your baby. A change in hormones during pregnancy causes your breast tissue to grow and increases the number and size of your milk ducts. These hormones also allow proteins, sugars, and fats from your blood supply to make breast milk in your milk-producing glands. Hormones prevent breast milk from being released before your baby is born as well as prompt milk flow after birth. Once breastfeeding has begun, thoughts of your baby, as well as his or her sucking or crying, can stimulate the release of milk from your milk-producing glands.  BENEFITS OF BREASTFEEDING For Your Baby  Your first milk (colostrum) helps your baby's digestive system function better.   There are antibodies in your milk that help your baby fight off infections.   Your baby has a lower incidence of asthma, allergies, and sudden infant death syndrome.   The nutrients in breast milk are better for your baby than infant formulas and are designed uniquely for your baby's needs.   Breast milk improves your baby's brain development.   Your baby is less likely to develop other conditions, such as childhood obesity, asthma, or type 2 diabetes mellitus.  For You   Breastfeeding helps to create a very special bond between you and your baby.   Breastfeeding is convenient. Breast milk is always available at the correct temperature and costs nothing.   Breastfeeding helps to burn calories and helps you lose the weight gained during pregnancy.   Breastfeeding makes your uterus contract to its prepregnancy size faster and slows bleeding (lochia) after you give birth.   Breastfeeding helps to lower your risk of developing type 2 diabetes mellitus, osteoporosis, and breast or ovarian cancer later in life. SIGNS THAT YOUR BABY IS HUNGRY Early Signs of Hunger  Increased alertness or activity.  Stretching.  Movement of the head from  side to side.  Movement of the head and opening of the mouth when the corner of the mouth or cheek is stroked (rooting).  Increased sucking sounds, smacking lips, cooing, sighing, or squeaking.  Hand-to-mouth movements.  Increased sucking of fingers or hands. Late Signs of Hunger  Fussing.  Intermittent crying. Extreme Signs of Hunger Signs of extreme hunger will require calming and consoling before your baby will be able to breastfeed successfully. Do not wait for the following signs of extreme hunger to occur before you initiate breastfeeding:   Restlessness.  A loud, strong cry.   Screaming. BREASTFEEDING BASICS Breastfeeding Initiation  Find a comfortable place to sit or lie down, with your neck and back well supported.  Place a pillow or rolled up blanket under your baby to bring him or her to the level of your breast (if you are seated). Nursing pillows are specially designed to help support your arms and your baby while you breastfeed.  Make sure that your baby's abdomen is facing your abdomen.   Gently massage your breast. With your fingertips, massage from your chest wall toward your nipple in a circular motion. This encourages milk flow. You may need to continue this action during the feeding if your milk flows slowly.  Support your breast with 4 fingers underneath and your thumb above your nipple. Make sure your fingers are well away from your nipple and your baby's mouth.   Stroke your baby's lips gently with your finger or nipple.   When your baby's mouth is open wide enough, quickly bring your baby to your  breast, placing your entire nipple and as much of the colored area around your nipple (areola) as possible into your baby's mouth.   More areola should be visible above your baby's upper lip than below the lower lip.   Your baby's tongue should be between his or her lower gum and your breast.   Ensure that your baby's mouth is correctly positioned  around your nipple (latched). Your baby's lips should create a seal on your breast and be turned out (everted).  It is common for your baby to suck about 2-3 minutes in order to start the flow of breast milk. Latching Teaching your baby how to latch on to your breast properly is very important. An improper latch can cause nipple pain and decreased milk supply for you and poor weight gain in your baby. Also, if your baby is not latched onto your nipple properly, he or she may swallow some air during feeding. This can make your baby fussy. Burping your baby when you switch breasts during the feeding can help to get rid of the air. However, teaching your baby to latch on properly is still the best way to prevent fussiness from swallowing air while breastfeeding. Signs that your baby has successfully latched on to your nipple:    Silent tugging or silent sucking, without causing you pain.   Swallowing heard between every 3-4 sucks.    Muscle movement above and in front of his or her ears while sucking.  Signs that your baby has not successfully latched on to nipple:   Sucking sounds or smacking sounds from your baby while breastfeeding.  Nipple pain. If you think your baby has not latched on correctly, slip your finger into the corner of your baby's mouth to break the suction and place it between your baby's gums. Attempt breastfeeding initiation again. Signs of Successful Breastfeeding Signs from your baby:   A gradual decrease in the number of sucks or complete cessation of sucking.   Falling asleep.   Relaxation of his or her body.   Retention of a small amount of milk in his or her mouth.   Letting go of your breast by himself or herself. Signs from you:  Breasts that have increased in firmness, weight, and size 1-3 hours after feeding.   Breasts that are softer immediately after breastfeeding.  Increased milk volume, as well as a change in milk consistency and color by  the fifth day of breastfeeding.   Nipples that are not sore, cracked, or bleeding. Signs That Your Baby is Getting Enough Milk  Wetting at least 3 diapers in a 24-hour period. The urine should be clear and pale yellow by age 5 days.  At least 3 stools in a 24-hour period by age 5 days. The stool should be soft and yellow.  At least 3 stools in a 24-hour period by age 7 days. The stool should be seedy and yellow.  No loss of weight greater than 10% of birth weight during the first 3 days of age.  Average weight gain of 4-7 ounces (113-198 g) per week after age 4 days.  Consistent daily weight gain by age 5 days, without weight loss after the age of 2 weeks. After a feeding, your baby may spit up a small amount. This is common. BREASTFEEDING FREQUENCY AND DURATION Frequent feeding will help you make more milk and can prevent sore nipples and breast engorgement. Breastfeed when you feel the need to reduce the fullness of your breasts   or when your baby shows signs of hunger. This is called "breastfeeding on demand." Avoid introducing a pacifier to your baby while you are working to establish breastfeeding (the first 4-6 weeks after your baby is born). After this time you may choose to use a pacifier. Research has shown that pacifier use during the first year of a baby's life decreases the risk of sudden infant death syndrome (SIDS). Allow your baby to feed on each breast as long as he or she wants. Breastfeed until your baby is finished feeding. When your baby unlatches or falls asleep while feeding from the first breast, offer the second breast. Because newborns are often sleepy in the first few weeks of life, you may need to awaken your baby to get him or her to feed. Breastfeeding times will vary from baby to baby. However, the following rules can serve as a guide to help you ensure that your baby is properly fed:  Newborns (babies 59 weeks of age or younger) may breastfeed every 1-3  hours.  Newborns should not go longer than 3 hours during the day or 5 hours during the night without breastfeeding.  You should breastfeed your baby a minimum of 8 times in a 24-hour period until you begin to introduce solid foods to your baby at around 11 months of age. BREAST MILK PUMPING Pumping and storing breast milk allows you to ensure that your baby is exclusively fed your breast milk, even at times when you are unable to breastfeed. This is especially important if you are going back to work while you are still breastfeeding or when you are not able to be present during feedings. Your lactation consultant can give you guidelines on how long it is safe to store breast milk.  A breast pump is a machine that allows you to pump milk from your breast into a sterile bottle. The pumped breast milk can then be stored in a refrigerator or freezer. Some breast pumps are operated by hand, while others use electricity. Ask your lactation consultant which type will work best for you. Breast pumps can be purchased, but some hospitals and breastfeeding support groups lease breast pumps on a monthly basis. A lactation consultant can teach you how to hand express breast milk, if you prefer not to use a pump.  CARING FOR YOUR BREASTS WHILE YOU BREASTFEED Nipples can become dry, cracked, and sore while breastfeeding. The following recommendations can help keep your breasts moisturized and healthy:  Avoid using soap on your nipples.   Wear a supportive bra. Although not required, special nursing bras and tank tops are designed to allow access to your breasts for breastfeeding without taking off your entire bra or top. Avoid wearing underwire-style bras or extremely tight bras.  Air dry your nipples for 3-65minutes after each feeding.   Use only cotton bra pads to absorb leaked breast milk. Leaking of breast milk between feedings is normal.   Use lanolin on your nipples after breastfeeding. Lanolin helps to  maintain your skin's normal moisture barrier. If you use pure lanolin, you do not need to wash it off before feeding your baby again. Pure lanolin is not toxic to your baby. You may also hand express a few drops of breast milk and gently massage that milk into your nipples and allow the milk to air dry. In the first few weeks after giving birth, some women experience extremely full breasts (engorgement). Engorgement can make your breasts feel heavy, warm, and tender to the  touch. Engorgement peaks within 3-5 days after you give birth. The following recommendations can help ease engorgement:  Completely empty your breasts while breastfeeding or pumping. You may want to start by applying warm, moist heat (in the shower or with warm water-soaked hand towels) just before feeding or pumping. This increases circulation and helps the milk flow. If your baby does not completely empty your breasts while breastfeeding, pump any extra milk after he or she is finished.  Wear a snug bra (nursing or regular) or tank top for 1-2 days to signal your body to slightly decrease milk production.  Apply ice packs to your breasts, unless this is too uncomfortable for you.  Make sure that your baby is latched on and positioned properly while breastfeeding. If engorgement persists after 48 hours of following these recommendations, contact your health care provider or a Advertising copywriterlactation consultant. OVERALL HEALTH CARE RECOMMENDATIONS WHILE BREASTFEEDING  Eat healthy foods. Alternate between meals and snacks, eating 3 of each per day. Because what you eat affects your breast milk, some of the foods may make your baby more irritable than usual. Avoid eating these foods if you are sure that they are negatively affecting your baby.  Drink milk, fruit juice, and water to satisfy your thirst (about 10 glasses a day).   Rest often, relax, and continue to take your prenatal vitamins to prevent fatigue, stress, and anemia.  Continue  breast self-awareness checks.  Avoid chewing and smoking tobacco.  Avoid alcohol and drug use. Some medicines that may be harmful to your baby can pass through breast milk. It is important to ask your health care provider before taking any medicine, including all over-the-counter and prescription medicine as well as vitamin and herbal supplements. It is possible to become pregnant while breastfeeding. If birth control is desired, ask your health care provider about options that will be safe for your baby. SEEK MEDICAL CARE IF:   You feel like you want to stop breastfeeding or have become frustrated with breastfeeding.  You have painful breasts or nipples.  Your nipples are cracked or bleeding.  Your breasts are red, tender, or warm.  You have a swollen area on either breast.  You have a fever or chills.  You have nausea or vomiting.  You have drainage other than breast milk from your nipples.  Your breasts do not become full before feedings by the fifth day after you give birth.  You feel sad and depressed.  Your baby is too sleepy to eat well.  Your baby is having trouble sleeping.   Your baby is wetting less than 3 diapers in a 24-hour period.  Your baby has less than 3 stools in a 24-hour period.  Your baby's skin or the white part of his or her eyes becomes yellow.   Your baby is not gaining weight by 385 days of age. SEEK IMMEDIATE MEDICAL CARE IF:   Your baby is overly tired (lethargic) and does not want to wake up and feed.  Your baby develops an unexplained fever. Document Released: 06/08/2005 Document Revised: 06/13/2013 Document Reviewed: 11/30/2012 [facility] Patient Information 2015 [place], [place]. This information is not intended to replace advice given to you by your health care provider. Make sure you discuss any questions you have with your health care provider. Postpartum Depression and Baby Blues The postpartum period begins right after the birth of a  baby. During this time, there is often a great amount of joy and excitement. It is also a time  of many changes in the life of the parents. Regardless of how many times a mother gives birth, each child brings new challenges and dynamics to the family. It is not unusual to have feelings of excitement along with confusing shifts in moods, emotions, and thoughts. All mothers are at risk of developing postpartum depression or the "baby blues." These mood changes can occur right after giving birth, or they may occur many months after giving birth. The baby blues or postpartum depression can be mild or severe. Additionally, postpartum depression can go away rather quickly, or it can be a long-term condition.  CAUSES Raised hormone levels and the rapid drop in those levels are thought to be a main cause of postpartum depression and the baby blues. A number of hormones change during and after pregnancy. Estrogen and progesterone usually decrease right after the delivery of your baby. The levels of thyroid hormone and various cortisol steroids also rapidly drop. Other factors that play a role in these mood changes include major life events and genetics.  RISK FACTORS If you have any of the following risks for the baby blues or postpartum depression, know what symptoms to watch out for during the postpartum period. Risk factors that may increase the likelihood of getting the baby blues or postpartum depression include:  Having a personal or family history of depression.   Having depression while being pregnant.   Having premenstrual mood issues or mood issues related to oral contraceptives.  Having a lot of life stress.   Having marital conflict.   Lacking a social support network.   Having a baby with special needs.   Having health problems, such as diabetes.  SIGNS AND SYMPTOMS Symptoms of baby blues include:  Brief changes in mood, such as going from extreme happiness to sadness.  Decreased  concentration.   Difficulty sleeping.   Crying spells, tearfulness.   Irritability.   Anxiety.  Symptoms of postpartum depression typically begin within the first month after giving birth. These symptoms include:  Difficulty sleeping or excessive sleepiness.   Marked weight loss.   Agitation.   Feelings of worthlessness.   Lack of interest in activity or food.  Postpartum psychosis is a very serious condition and can be dangerous. Fortunately, it is rare. Displaying any of the following symptoms is cause for immediate medical attention. Symptoms of postpartum psychosis include:   Hallucinations and delusions.   Bizarre or disorganized behavior.   Confusion or disorientation.  DIAGNOSIS  A diagnosis is made by an evaluation of your symptoms. There are no medical or lab tests that lead to a diagnosis, but there are various questionnaires that a health care provider may use to identify those with the baby blues, postpartum depression, or psychosis. Often, a screening tool called the [place] Postnatal Depression Scale is used to diagnose depression in the postpartum period.  TREATMENT The baby blues usually goes away on its own in 1-2 weeks. Social support is often all that is needed. You will be encouraged to get adequate sleep and rest. Occasionally, you may be given medicines to help you sleep.  Postpartum depression requires treatment because it can last several months or longer if it is not treated. Treatment may include individual or group therapy, medicine, or both to address any social, physiological, and psychological factors that may play a role in the depression. Regular exercise, a healthy diet, rest, and social support may also be strongly recommended.  Postpartum psychosis is more serious and needs treatment right away.  Hospitalization is often needed. HOME CARE INSTRUCTIONS  Get as much rest as you can. Nap when the baby sleeps.   Exercise regularly.  Some women find yoga and walking to be beneficial.   Eat a balanced and nourishing diet.   Do little things that you enjoy. Have a cup of tea, take a bubble bath, read your favorite magazine, or listen to your favorite music.  Avoid alcohol.   Ask for help with household chores, cooking, grocery shopping, or running errands as needed. Do not try to do everything.   Talk to people close to you about how you are feeling. Get support from your partner, family members, friends, or other new moms.  Try to stay positive in how you think. Think about the things you are grateful for.   Do not spend a lot of time alone.   Only take over-the-counter or prescription medicine as directed by your health care provider.  Keep all your postpartum appointments.   Let your health care provider know if you have any concerns.  SEEK MEDICAL CARE IF: You are having a reaction to or problems with your medicine. SEEK IMMEDIATE MEDICAL CARE IF:  You have suicidal feelings.   You think you may harm the baby or someone else. MAKE SURE YOU:  Understand these instructions.  Will watch your condition.  Will get help right away if you are not doing well or get worse. Document Released: 03/12/2004 Document Revised: 06/13/2013 Document Reviewed: 03/20/2013 [facility] Patient Information 2015 [place], [place]. This information is not intended to replace advice given to you by your health care provider. Make sure you discuss any questions you have with your health care provider. Vaginal Delivery, Care After Refer to this sheet in the next few weeks. These discharge instructions provide you with information on caring for yourself after delivery. Your caregiver may also give you specific instructions. Your treatment has been planned according to the most current medical practices available, but problems sometimes occur. Call your caregiver if you have any problems or questions after you go home. HOME CARE  INSTRUCTIONS  Take over-the-counter or prescription medicines only as directed by your caregiver or pharmacist.  Do not drink alcohol, especially if you are breastfeeding or taking medicine to relieve pain.  Do not chew or smoke tobacco.  Do not use illegal drugs.  Continue to use good perineal care. Good perineal care includes:  Wiping your perineum from front to back.  Keeping your perineum clean.  Do not use tampons or douche until your caregiver says it is okay.  Shower, wash your hair, and take tub baths as directed by your caregiver.  Wear a well-fitting bra that provides breast support.  Eat healthy foods.  Drink enough fluids to keep your urine clear or pale yellow.  Eat high-fiber foods such as whole grain cereals and breads, brown rice, beans, and fresh fruits and vegetables every day. These foods may help prevent or relieve constipation.  Follow your caregiver's recommendations regarding resumption of activities such as climbing stairs, driving, lifting, exercising, or traveling.  Talk to your caregiver about resuming sexual activities. Resumption of sexual activities is dependent upon your risk of infection, your rate of healing, and your comfort and desire to resume sexual activity.  Try to have someone help you with your household activities and your newborn for at least a few days after you leave the hospital.  Rest as much as possible. Try to rest or take a nap when your newborn is  sleeping.  Increase your activities gradually.  Keep all of your scheduled postpartum appointments. It is very important to keep your scheduled follow-up appointments. At these appointments, your caregiver will be checking to make sure that you are healing physically and emotionally. SEEK MEDICAL CARE IF:   You are passing large clots from your vagina. Save any clots to show your caregiver.  You have a foul smelling discharge from your vagina.  You have trouble urinating.  You  are urinating frequently.  You have pain when you urinate.  You have a change in your bowel movements.  You have increasing redness, pain, or swelling near your vaginal incision (episiotomy) or vaginal tear.  You have pus draining from your episiotomy or vaginal tear.  Your episiotomy or vaginal tear is separating.  You have painful, hard, or reddened breasts.  You have a severe headache.  You have blurred vision or see spots.  You feel sad or depressed.  You have thoughts of hurting yourself or your newborn.  You have questions about your care, the care of your newborn, or medicines.  You are dizzy or light-headed.  You have a rash.  You have nausea or vomiting.  You were breastfeeding and have not had a menstrual period within 12 weeks after you stopped breastfeeding.  You are not breastfeeding and have not had a menstrual period by the 12th week after delivery.  You have a fever. SEEK IMMEDIATE MEDICAL CARE IF:   You have persistent pain.  You have chest pain.  You have shortness of breath.  You faint.  You have leg pain.  You have stomach pain.  Your vaginal bleeding saturates two or more sanitary pads in 1 hour. MAKE SURE YOU:   Understand these instructions.  Will watch your condition.  Will get help right away if you are not doing well or get worse. Document Released: 06/05/2000 Document Revised: 10/23/2013 Document Reviewed: 02/03/2012 [facility] Patient Information 2015 [place], [place]. This information is not intended to replace advice given to you by your health care provider. Make sure you discuss any questions you have with your health care provider.
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
## 2014-02-12 NOTE — Progress Notes (Signed)
UR chart review completed.  

## 2014-02-12 NOTE — Anesthesia Postprocedure Evaluation (Signed)
Anesthesia Post Note  Patient: [name] [name]  Procedure(s) Performed: * No procedures listed *  Anesthesia type: Epidural  Patient location: Mother/Baby  Post pain: Pain level controlled  Post assessment: Post-op Vital signs reviewed  Last Vitals:  Filed Vitals:   02/12/14 0508  BP: 116/60  Pulse: 75  Temp: 36.9 C  Resp: 17    Post vital signs: Reviewed  Level of consciousness:alert  Complications: No apparent anesthesia complications

## 2014-02-12 NOTE — Lactation Note (Signed)
This note was copied from the chart of [name] Arna Luis. Lactation Consultation Note  Patient Name: [name] [name] ZOXWR'U Date: 02/12/2014 Reason for consult: Follow-up assessment;Difficult latch Mom has previous breastfeeding experience with 2 other children but she reports having some latch difficulty with this newborn, now 52 hours of age.  Baby is STS with mom and sounds stuffy (nasal) so LC suggests upright football position.  After a few efforts to latch, he finally grasps areola well and begins rhythmical sucking bursts and intermittent swallows.  Nasal stuffiness resolves quickly but LC discussed with mom and RN, Gaylyn Rong that if stuffiness persists, saline nose gtts ac may help.  Mom reports that her (R) breast has no flow with hand expression and baby prefers (L).  LC recommends breast massage, hand expression and 10-15 minutes of pumping with hand pump every 3 hours if baby not latching to that breast.  LC encouraged STS and cue feedings on preferred breast and reminded mom that ebm storage guidelines are on page 25 of Baby and Me book.     Maternal Data    Feeding Feeding Type: Breast Fed  LATCH Score/Interventions Latch: Repeated attempts needed to sustain latch, nipple held in mouth throughout feeding, stimulation needed to elicit sucking reflex. Intervention(s): Adjust position;Assist with latch;Breast compression  Audible Swallowing: A few with stimulation Intervention(s): Skin to skin;Hand expression Intervention(s): Skin to skin;Hand expression;Alternate breast massage  Type of Nipple: Everted at rest and after stimulation  Comfort (Breast/Nipple): Soft / non-tender     Hold (Positioning): Assistance needed to correctly position infant at breast and maintain latch. Intervention(s): Breastfeeding basics reviewed;Support Pillows;Position options;Skin to skin (baby has nasal stuffiness but once latched in football hold, it resolves)  LATCH Score: 7 (LC assisted and  observed)  Lactation Tools Discussed/Used Pump Review: Milk Storage STS, cue feedings, positioning upright if baby stuffy  Consult Status Consult Status: Follow-up Date: 02/13/14 Follow-up type: In-patient    Warrick Parisian [facility] 02/12/2014, 6:02 PM
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
## 2014-02-13 MED ORDER — OXYCODONE-ACETAMINOPHEN 5-325 MG PO TABS: 1.0000 | ORAL_TABLET | ORAL | Status: DC | PRN

## 2014-02-13 MED ORDER — FERROUS SULFATE 325 (65 FE) MG PO TABS: 325.0000 mg | ORAL_TABLET | Freq: Every day | ORAL | Status: DC

## 2014-02-13 MED ORDER — IBUPROFEN 600 MG PO TABS: 600.0000 mg | ORAL_TABLET | Freq: Four times a day (QID) | ORAL | Status: DC

## 2014-02-13 NOTE — Lactation Note (Signed)
This note was copied from the chart of [name] [name]. Lactation Consultation Note  Upon entering the room, mother has baby in cradle position breastfeeding. Sucks and some swallows observed.  Mother's right nipple has crack and has bleed, unsure if this was rusty pipe. Provided mother with comfort gels and reviewed use and using ebm. Reviewed engorgement care and encouraged her to attend support group.  Patient Name: [name] [name] ZOXWR'U Date: 02/13/2014 Reason for consult: Follow-up assessment   Maternal Data    Feeding Feeding Type: Breast Fed  LATCH Score/Interventions Latch: Grasps breast easily, tongue down, lips flanged, rhythmical sucking.  Audible Swallowing: Spontaneous and intermittent  Type of Nipple: Everted at rest and after stimulation  Comfort (Breast/Nipple): Filling, red/small blisters or bruises, mild/mod discomfort  Problem noted: Cracked, bleeding, blisters, bruises Interventions  (Cracked/bleeding/bruising/blister): Expressed breast milk to nipple  Hold (Positioning): No assistance needed to correctly position infant at breast.  LATCH Score: 9  Lactation Tools Discussed/Used     Consult Status Consult Status: Complete    Hardie Pulley 02/13/2014, 11:31 AM

## 2014-02-13 NOTE — Plan of Care (Signed)
Problem: Discharge Progression Outcomes Goal: Barriers To Progression Addressed/Resolved No barriers to -discharge progression, friend with good support noted in the room and helping with discharge

## 2014-04-23 ENCOUNTER — Encounter (HOSPITAL_COMMUNITY): Payer: Self-pay

## 2015-06-21 ENCOUNTER — Inpatient Hospital Stay (HOSPITAL_COMMUNITY): Payer: Medicaid Other

## 2015-06-21 ENCOUNTER — Inpatient Hospital Stay (HOSPITAL_COMMUNITY)
Admission: AD | Admit: 2015-06-21 | Discharge: 2015-06-21 | Disposition: A | Discharge status: Home / Self Care | Payer: Medicaid Other | Source: Ambulatory Visit | Attending: Obstetrics & Gynecology | Admitting: Obstetrics & Gynecology

## 2015-06-21 ENCOUNTER — Encounter (HOSPITAL_COMMUNITY): Payer: Self-pay | Admitting: *Deleted

## 2015-06-21 DIAGNOSIS — J45909 Unspecified asthma, uncomplicated: Secondary | ICD-10-CM | POA: Insufficient documentation

## 2015-06-21 DIAGNOSIS — F319 Bipolar disorder, unspecified: Secondary | ICD-10-CM | POA: Insufficient documentation

## 2015-06-21 DIAGNOSIS — O2690 Pregnancy related conditions, unspecified, unspecified trimester: Secondary | ICD-10-CM | POA: Diagnosis not present

## 2015-06-21 DIAGNOSIS — R110 Nausea: Secondary | ICD-10-CM | POA: Diagnosis present

## 2015-06-21 DIAGNOSIS — Z87891 Personal history of nicotine dependence: Secondary | ICD-10-CM | POA: Diagnosis not present

## 2015-06-21 DIAGNOSIS — O3680X0 Pregnancy with inconclusive fetal viability, not applicable or unspecified: Secondary | ICD-10-CM

## 2015-06-21 DIAGNOSIS — R109 Unspecified abdominal pain: Secondary | ICD-10-CM | POA: Insufficient documentation

## 2015-06-21 LAB — HCG, QUANTITATIVE, PREGNANCY: HCG, BETA CHAIN, QUANT, S: 37689 m[IU]/mL — AB (ref <5)

## 2015-06-21 LAB — URINALYSIS, ROUTINE W REFLEX MICROSCOPIC
Bilirubin Urine: NEGATIVE
GLUCOSE, UA: NEGATIVE mg/dL
Ketones, ur: NEGATIVE mg/dL
Nitrite: NEGATIVE
PH: 5.5 (ref 5.0–8.0)
Protein, ur: NEGATIVE mg/dL
Specific Gravity, Urine: >1.03 — ABNORMAL HIGH (ref 1.005–1.030)

## 2015-06-21 LAB — URINE MICROSCOPIC-ADD ON: RBC / HPF: NONE SEEN RBC/hpf (ref 0–5)

## 2015-06-21 LAB — POCT PREGNANCY, URINE: Preg Test, Ur: POSITIVE — AB

## 2015-06-21 MED ORDER — ACETAMINOPHEN 500 MG PO TABS
1000.0000 mg | ORAL_TABLET | Freq: Once | ORAL | Status: AC
  Administered 2015-06-21: 1000 mg via ORAL
  Filled 2015-06-21: qty 2

## 2015-06-21 MED ORDER — ONDANSETRON 8 MG PO TBDP
8.0000 mg | ORAL_TABLET | Freq: Once | ORAL | Status: AC
  Administered 2015-06-21: 8 mg via ORAL
  Filled 2015-06-21: qty 1

## 2015-06-21 MED ORDER — ONDANSETRON 4 MG PO TBDP: 4.0000 mg | ORAL_TABLET | Freq: Three times a day (TID) | ORAL | Status: DC | PRN

## 2015-06-21 MED ORDER — ONDANSETRON HCL 4 MG PO TABS: 4.0000 mg | ORAL_TABLET | Freq: Four times a day (QID) | ORAL | Status: DC | PRN

## 2015-06-21 NOTE — Discharge Instructions (Signed)
Subchorionic Hematoma °A subchorionic hematoma is a gathering of blood between the outer wall of the placenta and the inner wall of the womb (uterus). The placenta is the organ that connects the fetus to the wall of the uterus. The placenta performs the feeding, breathing (oxygen to the fetus), and waste removal (excretory work) of the fetus.  °Subchorionic hematoma is the most common abnormality found on a result from ultrasonography done during the first trimester or early second trimester of pregnancy. If there has been little or no vaginal bleeding, early small hematomas usually shrink on their own and do not affect your baby or pregnancy. The blood is gradually absorbed over 1-2 weeks. When bleeding starts later in pregnancy or the hematoma is larger or occurs in an older pregnant woman, the outcome may not be as good. Larger hematomas may get bigger, which increases the chances for miscarriage. Subchorionic hematoma also increases the risk of premature detachment of the placenta from the uterus, preterm (premature) labor, and stillbirth. °HOME CARE INSTRUCTIONS °· Stay on bed rest if your health care provider recommends this. Although bed rest will not prevent more bleeding or prevent a miscarriage, your health care provider may recommend bed rest until you are advised otherwise. °· Avoid heavy lifting (more than 10 lb [4.5 kg]), exercise, sexual intercourse, or douching as directed by your health care provider. °· Keep track of the number of pads you use each day and how soaked (saturated) they are. Write down this information. °· Do not use tampons. °· Keep all follow-up appointments as directed by your health care provider. Your health care provider may ask you to have follow-up blood tests or ultrasound tests or both. °SEEK IMMEDIATE MEDICAL CARE IF: °· You have severe cramps in your stomach, back, abdomen, or pelvis. °· You have a fever. °· You pass large clots or tissue. Save any tissue for your health  care provider to look at. °· Your bleeding increases or you become lightheaded, feel weak, or have fainting episodes. °  °This information is not intended to replace advice given to you by your health care provider. Make sure you discuss any questions you have with your health care provider. °  °Document Released: 09/23/2006 Document Revised: 06/29/2014 Document Reviewed: 01/05/2013 °Elsevier Interactive Patient Education ©2016 Elsevier Inc. ° °

## 2015-06-21 NOTE — MAU Note (Signed)
PT  SAYS   HER  LMP-  11-17.     LAST   SEX-  WAS 11-24          SHE TOOK MORNING AFTER  PILL    ON 11-25     .    Marland Kitchen.   HPT-  ON       12-11 OR 12-18 WAS  NEG   BUT  ON   12-25-  POSITIVE.    Marland Kitchen. STARTED  FEELING  NAUSEA  ON  12-25.     VOMITED   TODAY  SEVERAL   TIMES.      FEELS CRAMPING  IN ABD   .    LAST  SEEN  AT [facility]-   02-2014    PT  DID  NOT  CALL OFFICE.
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
## 2015-06-22 LAB — GC/CHLAMYDIA PROBE AMP (~~LOC~~) NOT AT ARMC
CHLAMYDIA, DNA PROBE: NEGATIVE
Neisseria Gonorrhea: NEGATIVE

## 2015-06-24 ENCOUNTER — Inpatient Hospital Stay (HOSPITAL_COMMUNITY)
Admission: AD | Admit: 2015-06-24 | Discharge: 2015-06-24 | Disposition: A | Discharge status: Home / Self Care | Payer: Medicaid Other | Source: Ambulatory Visit | Attending: Obstetrics & Gynecology | Admitting: Obstetrics & Gynecology

## 2015-06-24 DIAGNOSIS — Z3491 Encounter for supervision of normal pregnancy, unspecified, first trimester: Secondary | ICD-10-CM | POA: Insufficient documentation

## 2015-06-24 LAB — CBC
HCT: 41.8 % (ref 36.0–46.0)
Hemoglobin: 14.7 g/dL (ref 12.0–15.0)
MCH: 31.9 pg (ref 26.0–34.0)
MCHC: 35.2 g/dL (ref 30.0–36.0)
MCV: 90.7 fL (ref 78.0–100.0)
Platelets: 177 10*3/uL (ref 150–400)
RBC: 4.61 MIL/uL (ref 3.87–5.11)
RDW: 12.4 % (ref 11.5–15.5)
WBC: 7.4 10*3/uL (ref 4.0–10.5)

## 2015-06-24 LAB — HCG, QUANTITATIVE, PREGNANCY: hCG, Beta Chain, Quant, S: 86034 m[IU]/mL — ABNORMAL HIGH (ref <5)

## 2015-06-24 NOTE — MAU Note (Signed)
Pt reports she is here to get bloodwork repeated. Pt states she just has not felt good since she was seen here on 12/30. States she feels tired, weak, and doesn't feel like eating.

## 2015-06-28 ENCOUNTER — Inpatient Hospital Stay (HOSPITAL_COMMUNITY)
Admission: AD | Admit: 2015-06-28 | Discharge: 2015-06-28 | Disposition: A | Discharge status: Home / Self Care | Payer: Medicaid Other | Source: Ambulatory Visit | Attending: Obstetrics and Gynecology | Admitting: Obstetrics and Gynecology

## 2015-06-28 ENCOUNTER — Encounter (HOSPITAL_COMMUNITY): Payer: Self-pay | Admitting: *Deleted

## 2015-06-28 DIAGNOSIS — N12 Tubulo-interstitial nephritis, not specified as acute or chronic: Secondary | ICD-10-CM | POA: Insufficient documentation

## 2015-06-28 DIAGNOSIS — N189 Chronic kidney disease, unspecified: Secondary | ICD-10-CM | POA: Diagnosis not present

## 2015-06-28 DIAGNOSIS — Z91410 Personal history of adult physical and sexual abuse: Secondary | ICD-10-CM | POA: Diagnosis not present

## 2015-06-28 DIAGNOSIS — Z87891 Personal history of nicotine dependence: Secondary | ICD-10-CM | POA: Diagnosis not present

## 2015-06-28 DIAGNOSIS — Z3A01 Less than 8 weeks gestation of pregnancy: Secondary | ICD-10-CM | POA: Diagnosis not present

## 2015-06-28 DIAGNOSIS — O219 Vomiting of pregnancy, unspecified: Secondary | ICD-10-CM | POA: Insufficient documentation

## 2015-06-28 DIAGNOSIS — K5900 Constipation, unspecified: Secondary | ICD-10-CM | POA: Insufficient documentation

## 2015-06-28 DIAGNOSIS — J45909 Unspecified asthma, uncomplicated: Secondary | ICD-10-CM | POA: Diagnosis not present

## 2015-06-28 DIAGNOSIS — F319 Bipolar disorder, unspecified: Secondary | ICD-10-CM | POA: Insufficient documentation

## 2015-06-28 DIAGNOSIS — Z880 Allergy status to penicillin: Secondary | ICD-10-CM | POA: Diagnosis not present

## 2015-06-28 DIAGNOSIS — D696 Thrombocytopenia, unspecified: Secondary | ICD-10-CM | POA: Insufficient documentation

## 2015-06-28 LAB — URINE MICROSCOPIC-ADD ON

## 2015-06-28 LAB — URINALYSIS, ROUTINE W REFLEX MICROSCOPIC
GLUCOSE, UA: NEGATIVE mg/dL
KETONES UR: 40 mg/dL — AB
Nitrite: NEGATIVE
PH: 6 (ref 5.0–8.0)
Protein, ur: NEGATIVE mg/dL
Specific Gravity, Urine: 1.025 (ref 1.005–1.030)

## 2015-06-28 MED ORDER — DOXYLAMINE-PYRIDOXINE 10-10 MG PO TBEC: DELAYED_RELEASE_TABLET | ORAL | Status: DC

## 2015-06-28 MED ORDER — GLYCERIN (LAXATIVE) 2.1 G RE SUPP
1.0000 | Freq: Once | RECTAL | Status: AC
  Administered 2015-06-28: 05:00:00 via RECTAL
  Filled 2015-06-28: qty 1

## 2015-06-28 MED ORDER — LACTATED RINGERS IV BOLUS (SEPSIS)
500.0000 mL | Freq: Once | INTRAVENOUS | Status: AC
  Administered 2015-06-28: 1000 mL via INTRAVENOUS

## 2015-06-28 MED ORDER — SODIUM CHLORIDE 0.9 % IV SOLN
8.0000 mg | Freq: Once | INTRAVENOUS | Status: AC
  Administered 2015-06-28: 8 mg via INTRAVENOUS
  Filled 2015-06-28: qty 4

## 2015-06-28 MED ORDER — LACTATED RINGERS IV SOLN: INTRAVENOUS | Status: DC

## 2015-06-28 MED ORDER — ACETAMINOPHEN 500 MG PO TABS
1000.0000 mg | ORAL_TABLET | Freq: Once | ORAL | Status: AC
  Administered 2015-06-28: 1000 mg via ORAL
  Filled 2015-06-28: qty 2

## 2015-06-28 NOTE — Discharge Instructions (Signed)
Hyperemesis Gravidarum  Hyperemesis gravidarum is a severe form of nausea and vomiting that happens during pregnancy. Hyperemesis is worse than morning sickness. It may cause you to have nausea or vomiting all day for many days. It may keep you from eating and drinking enough food and liquids. Hyperemesis usually occurs during the first half (the first 20 weeks) of pregnancy. It often goes away once a woman is in her second half of pregnancy. However, sometimes hyperemesis continues through an entire pregnancy.   CAUSES   The cause of this condition is not completely known but is thought to be related to changes in the body's hormones when pregnant. It could be from the high level of the pregnancy hormone or an increase in estrogen in the body.   SIGNS AND SYMPTOMS    Severe nausea and vomiting.   Nausea that does not go away.   Vomiting that does not allow you to keep any food down.   Weight loss and body fluid loss (dehydration).   Having no desire to eat or not liking food you have previously enjoyed.  DIAGNOSIS   Your health care provider will do a physical exam and ask you about your symptoms. He or she may also order blood tests and urine tests to make sure something else is not causing the problem.   TREATMENT   You may only need medicine to control the problem. If medicines do not control the nausea and vomiting, you will be treated in the hospital to prevent dehydration, increased acid in the blood (acidosis), weight loss, and changes in the electrolytes in your body that may harm the unborn baby (fetus). You may need IV fluids.   HOME CARE INSTRUCTIONS    Only take over-the-counter or prescription medicines as directed by your health care provider.   Try eating a couple of dry crackers or toast in the morning before getting out of bed.   Avoid foods and smells that upset your stomach.   Avoid fatty and spicy foods.   Eat 5-6 small meals a day.   Do not drink when eating meals. Drink between  meals.   For snacks, eat high-protein foods, such as cheese.   Eat or suck on things that have ginger in them. Ginger helps nausea.   Avoid food preparation. The smell of food can spoil your appetite.   Avoid iron pills and iron in your multivitamins until after 3-4 months of being pregnant. However, consult with your health care provider before stopping any prescribed iron pills.  SEEK MEDICAL CARE IF:    Your abdominal pain increases.   You have a severe headache.   You have vision problems.   You are losing weight.  SEEK IMMEDIATE MEDICAL CARE IF:    You are unable to keep fluids down.   You vomit blood.   You have constant nausea and vomiting.   You have excessive weakness.   You have extreme thirst.   You have dizziness or fainting.   You have a fever or persistent symptoms for more than 2-3 days.   You have a fever and your symptoms suddenly get worse.  MAKE SURE YOU:    Understand these instructions.   Will watch your condition.   Will get help right away if you are not doing well or get worse.     This information is not intended to replace advice given to you by your health care provider. Make sure you discuss any questions you have with   your health care provider.     Document Released: 06/08/2005 Document Revised: 03/29/2013 Document Reviewed: 01/18/2013  Elsevier Interactive Patient Education 2016 Elsevier Inc.

## 2015-06-28 NOTE — MAU Note (Signed)
PT  HAS  ARRIVED   VOMITING  IN B-ROOM   SAYS  SHE HAS PHENERGAN  AT  HOME  -   TOOK 12.5 MG  TODAY   BUT  DOES NOT  TAKE  IT - BC MAKES  HER   SLEEPY  AND  SHE  HAS  TO TAKE  CARE  OF  CHILDREN.
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
## 2015-06-29 LAB — CULTURE, OB URINE

## 2015-07-07 ENCOUNTER — Ambulatory Visit (HOSPITAL_BASED_OUTPATIENT_CLINIC_OR_DEPARTMENT_OTHER): Payer: Medicaid Other | Attending: Internal Medicine

## 2015-07-07 VITALS — Ht 66.0 in | Wt 194.0 lb

## 2015-07-07 DIAGNOSIS — R0683 Snoring: Secondary | ICD-10-CM | POA: Diagnosis not present

## 2015-07-07 DIAGNOSIS — G4719 Other hypersomnia: Secondary | ICD-10-CM | POA: Diagnosis present

## 2015-07-07 DIAGNOSIS — G4733 Obstructive sleep apnea (adult) (pediatric): Secondary | ICD-10-CM | POA: Diagnosis not present

## 2015-07-13 DIAGNOSIS — G4733 Obstructive sleep apnea (adult) (pediatric): Secondary | ICD-10-CM

## 2015-07-13 NOTE — Progress Notes (Signed)
   NAME: [name] [name] DATE OF BIRTH:  02-May-1986 MEDICAL RECORD NUMBER [id number]  LOCATION: [facility]  PHYSICIAN: YOUNG,CLINTON D  DATE OF STUDY: 07/07/2015 CLINICAL INFORMATION Sleep Study Type: NPSG Indication for sleep study: Excessive Daytime Sleepiness Epworth Sleepiness Score: 13  SLEEP STUDY TECHNIQUE As per the AASM Manual for the Scoring of Sleep and Associated Events v2.3 (April 2016) with a hypopnea requiring 4% desaturations. The channels recorded and monitored were frontal, central and occipital EEG, electrooculogram (EOG), submentalis EMG (chin), nasal and oral airflow, thoracic and abdominal wall motion, anterior tibialis EMG, snore microphone, electrocardiogram, and pulse oximetry.  MEDICATIONS Patient's medications include: charted for review Medications self-administered by patient during sleep study : No sleep medicine administered.  SLEEP ARCHITECTURE The study was initiated at 10:29:22 PM and ended at 4:46:18 AM. Sleep onset time was 4.6 minutes and the sleep efficiency was 94.0%. The total sleep time was 354.5 minutes. Stage REM latency was 56.5 minutes. The patient spent 4.80% of the night in stage N1 sleep, 57.12% in stage N2 sleep, 5.22% in stage N3 and 32.86% in REM. Alpha intrusion was absent. Supine sleep was 68.53%.  RESPIRATORY PARAMETERS The overall apnea/hypopnea index (AHI) was 3.4 per hour. There were 0 total apneas, including 0 obstructive, 0 central and 0 mixed apneas. There were 20 hypopneas and 15 RERAs. The AHI during Stage REM sleep was 9.8 per hour. AHI while supine was 4.4 per hour. The mean oxygen saturation was 93.39%. The minimum SpO2 during sleep was 88.00%. Moderate snoring was noted during this study.  CARDIAC DATA The 2 lead EKG demonstrated sinus rhythm. The mean heart rate was 69.00 beats per minute. Other EKG findings include: None.  LEG MOVEMENT DATA The total PLMS were 0 with a resulting PLMS index of  0.00. Associated arousal with leg movement index was 0.0  . IMPRESSIONS - No significant obstructive sleep apnea occurred during this study (AHI = 3.4/h). - No significant central sleep apnea occurred during this study (CAI = 0.0/h). - The patient had minimal or no oxygen desaturation during the study (Min O2 = 88.00%) - The patient snored with Moderate snoring volume. - No cardiac abnormalities were noted during this study. - Clinically significant periodic limb movements did not occur during sleep. No significant associated arousals.  DIAGNOSIS - Primary Snoring (786.09 [R06.83 ICD-10]) - Normal study  RECOMMENDATIONS - Consider evaluation for other treatable causes of snoring if appropriate - Avoid alcohol, sedatives and other CNS depressants that may worsen sleep apnea and disrupt normal sleep architecture. - Sleep hygiene should be reviewed to assess factors that may improve sleep quality. - Weight management and regular exercise should be initiated or continued if appropriate.   Waymon Budge Diplomate, American Board of Sleep Medicine  ELECTRONICALLY SIGNED ON:  07/13/2015, 11:18 AM [facility] PH: [phone]   FX: [phone] ACCREDITED BY THE AMERICAN ACADEMY OF SLEEP MEDICINE

## 2015-08-13 DIAGNOSIS — R87619 Unspecified abnormal cytological findings in specimens from cervix uteri: Secondary | ICD-10-CM

## 2015-08-13 HISTORY — DX: Unspecified abnormal cytological findings in specimens from cervix uteri: R87.619

## 2016-04-25 ENCOUNTER — Encounter (HOSPITAL_COMMUNITY): Payer: Self-pay

## 2016-05-16 ENCOUNTER — Encounter (HOSPITAL_COMMUNITY): Payer: Self-pay | Admitting: Emergency Medicine

## 2016-05-16 ENCOUNTER — Emergency Department (HOSPITAL_COMMUNITY): Payer: Medicaid Other

## 2016-05-16 ENCOUNTER — Emergency Department (HOSPITAL_COMMUNITY)
Admission: EM | Admit: 2016-05-16 | Discharge: 2016-05-16 | Disposition: A | Discharge status: Home / Self Care | Payer: Medicaid Other | Attending: Emergency Medicine | Admitting: Emergency Medicine

## 2016-05-16 DIAGNOSIS — M533 Sacrococcygeal disorders, not elsewhere classified: Secondary | ICD-10-CM

## 2016-05-16 DIAGNOSIS — Y939 Activity, unspecified: Secondary | ICD-10-CM | POA: Diagnosis not present

## 2016-05-16 DIAGNOSIS — Z79899 Other long term (current) drug therapy: Secondary | ICD-10-CM | POA: Insufficient documentation

## 2016-05-16 DIAGNOSIS — M25571 Pain in right ankle and joints of right foot: Secondary | ICD-10-CM | POA: Insufficient documentation

## 2016-05-16 DIAGNOSIS — Z87891 Personal history of nicotine dependence: Secondary | ICD-10-CM | POA: Insufficient documentation

## 2016-05-16 DIAGNOSIS — W108XXA Fall (on) (from) other stairs and steps, initial encounter: Secondary | ICD-10-CM | POA: Insufficient documentation

## 2016-05-16 DIAGNOSIS — Y929 Unspecified place or not applicable: Secondary | ICD-10-CM | POA: Diagnosis not present

## 2016-05-16 DIAGNOSIS — Y999 Unspecified external cause status: Secondary | ICD-10-CM | POA: Diagnosis not present

## 2016-05-16 DIAGNOSIS — J45909 Unspecified asthma, uncomplicated: Secondary | ICD-10-CM | POA: Insufficient documentation

## 2016-05-16 MED ORDER — DOCUSATE SODIUM 100 MG PO CAPS: 100.0000 mg | ORAL_CAPSULE | Freq: Two times a day (BID) | ORAL | 0 refills | Status: DC

## 2016-05-16 MED ORDER — CELECOXIB 200 MG PO CAPS: 200.0000 mg | ORAL_CAPSULE | Freq: Two times a day (BID) | ORAL | 0 refills | Status: DC

## 2016-05-16 MED ORDER — TRAMADOL HCL 50 MG PO TABS
50.0000 mg | ORAL_TABLET | Freq: Once | ORAL | Status: AC
  Administered 2016-05-16: 50 mg via ORAL
  Filled 2016-05-16: qty 1

## 2016-05-16 MED ORDER — TRAMADOL HCL 50 MG PO TABS: 50.0000 mg | ORAL_TABLET | Freq: Four times a day (QID) | ORAL | 0 refills | Status: DC | PRN

## 2016-05-16 NOTE — ED Triage Notes (Signed)
Pt states she fell approx 2 weeks ago and was seen at PCP office.  Reports she was diagnosed with fractured tail bone.  Also reports R ankle pain but states it was not x-rayed.  Pt states she got a Rx for muscle relaxer but didn't get it filled.  Increased pain tonight.
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
## 2016-05-16 NOTE — Discharge Instructions (Signed)
Contact a health care provider if: °Your pain becomes worse. °Your bowel movements cause a great deal of discomfort. °You are unable to have a bowel movement. °You have uncontrolled urine loss (urinary incontinence). °You have a fever. °

## 2016-05-16 NOTE — ED Notes (Signed)
NAD. Pt ambulatory at DC. Verbalized understanding of DC info and RX.

## 2016-06-22 ENCOUNTER — Emergency Department (HOSPITAL_COMMUNITY)
Admission: EM | Admit: 2016-06-22 | Discharge: 2016-06-22 | Disposition: A | Discharge status: Home / Self Care | Payer: Medicaid Other | Attending: Emergency Medicine | Admitting: Emergency Medicine

## 2016-06-22 ENCOUNTER — Emergency Department (HOSPITAL_COMMUNITY): Payer: Medicaid Other

## 2016-06-22 ENCOUNTER — Encounter (HOSPITAL_COMMUNITY): Payer: Self-pay | Admitting: Emergency Medicine

## 2016-06-22 DIAGNOSIS — Y999 Unspecified external cause status: Secondary | ICD-10-CM | POA: Insufficient documentation

## 2016-06-22 DIAGNOSIS — S99912A Unspecified injury of left ankle, initial encounter: Secondary | ICD-10-CM | POA: Diagnosis present

## 2016-06-22 DIAGNOSIS — S93402A Sprain of unspecified ligament of left ankle, initial encounter: Secondary | ICD-10-CM | POA: Diagnosis not present

## 2016-06-22 DIAGNOSIS — W1839XA Other fall on same level, initial encounter: Secondary | ICD-10-CM | POA: Diagnosis not present

## 2016-06-22 DIAGNOSIS — Y929 Unspecified place or not applicable: Secondary | ICD-10-CM | POA: Diagnosis not present

## 2016-06-22 DIAGNOSIS — Z79899 Other long term (current) drug therapy: Secondary | ICD-10-CM | POA: Diagnosis not present

## 2016-06-22 DIAGNOSIS — N189 Chronic kidney disease, unspecified: Secondary | ICD-10-CM | POA: Diagnosis not present

## 2016-06-22 DIAGNOSIS — J45909 Unspecified asthma, uncomplicated: Secondary | ICD-10-CM | POA: Insufficient documentation

## 2016-06-22 DIAGNOSIS — Y939 Activity, unspecified: Secondary | ICD-10-CM | POA: Diagnosis not present

## 2016-06-22 DIAGNOSIS — Z87891 Personal history of nicotine dependence: Secondary | ICD-10-CM | POA: Insufficient documentation

## 2016-06-22 MED ORDER — IBUPROFEN 800 MG PO TABS
800.0000 mg | ORAL_TABLET | Freq: Once | ORAL | Status: AC
  Administered 2016-06-22: 800 mg via ORAL
  Filled 2016-06-22: qty 1

## 2016-06-22 MED ORDER — IBUPROFEN 800 MG PO TABS: 800.0000 mg | ORAL_TABLET | Freq: Three times a day (TID) | ORAL | 0 refills | Status: DC
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
## 2016-06-22 NOTE — ED Triage Notes (Signed)
Patient missed her step and injured her left foot this evening , presents with left foot pain with mild swelling .

## 2016-06-22 NOTE — Discharge Instructions (Signed)
1. Medications: alternate ibuprofen and tylenol for pain control, usual home medications 2. Treatment: rest, ice, elevate and use brace, drink plenty of fluids, gentle stretching 3. Follow Up: Please followup with orthopedics as directed or your PCP in 1 week if no improvement for discussion of your diagnoses and further evaluation after today's visit; if you do not have a primary care doctor use the resource guide provided to find one; Please return to the ER for worsening symptoms or other concerns  

## 2016-06-22 NOTE — ED Notes (Signed)
Patient transported to X-ray 

## 2017-01-01 ENCOUNTER — Emergency Department (HOSPITAL_COMMUNITY)
Admission: EM | Admit: 2017-01-01 | Discharge: 2017-01-01 | Disposition: A | Discharge status: Home / Self Care | Payer: Medicaid Other | Attending: Emergency Medicine | Admitting: Emergency Medicine

## 2017-01-01 ENCOUNTER — Encounter (HOSPITAL_COMMUNITY): Payer: Self-pay | Admitting: Emergency Medicine

## 2017-01-01 DIAGNOSIS — Y998 Other external cause status: Secondary | ICD-10-CM | POA: Diagnosis not present

## 2017-01-01 DIAGNOSIS — S91321A Laceration with foreign body, right foot, initial encounter: Secondary | ICD-10-CM | POA: Insufficient documentation

## 2017-01-01 DIAGNOSIS — Y92007 Garden or yard of unspecified non-institutional (private) residence as the place of occurrence of the external cause: Secondary | ICD-10-CM | POA: Insufficient documentation

## 2017-01-01 DIAGNOSIS — Z79899 Other long term (current) drug therapy: Secondary | ICD-10-CM | POA: Insufficient documentation

## 2017-01-01 DIAGNOSIS — T148XXA Other injury of unspecified body region, initial encounter: Secondary | ICD-10-CM

## 2017-01-01 DIAGNOSIS — Z87891 Personal history of nicotine dependence: Secondary | ICD-10-CM | POA: Insufficient documentation

## 2017-01-01 DIAGNOSIS — Z23 Encounter for immunization: Secondary | ICD-10-CM | POA: Diagnosis not present

## 2017-01-01 DIAGNOSIS — J45909 Unspecified asthma, uncomplicated: Secondary | ICD-10-CM | POA: Diagnosis not present

## 2017-01-01 DIAGNOSIS — Y939 Activity, unspecified: Secondary | ICD-10-CM | POA: Insufficient documentation

## 2017-01-01 DIAGNOSIS — W458XXA Other foreign body or object entering through skin, initial encounter: Secondary | ICD-10-CM | POA: Insufficient documentation

## 2017-01-01 MED ORDER — TETANUS-DIPHTH-ACELL PERTUSSIS 5-2.5-18.5 LF-MCG/0.5 IM SUSP
0.5000 mL | Freq: Once | INTRAMUSCULAR | Status: AC
  Administered 2017-01-01: 0.5 mL via INTRAMUSCULAR
  Filled 2017-01-01: qty 0.5

## 2017-01-01 NOTE — Discharge Instructions (Signed)
Please read and follow all provided instructions.  Your diagnoses today include:  1. Splinter in skin     Tests performed today include: Vital signs. See below for your results today.   Medications prescribed:  Take as prescribed   Home care instructions:  Follow any educational materials contained in this packet.  Follow-up instructions: Please follow-up with your primary care provider for further evaluation of symptoms and treatment   Return instructions:  Please return to the Emergency Department if you do not get better, if you get worse, or new symptoms OR  - Fever (temperature greater than 101.39F)  - Bleeding that does not stop with holding pressure to the area    -Severe pain (please note that you may be more sore the day after your accident)  - Chest Pain  - Difficulty breathing  - Severe nausea or vomiting  - Inability to tolerate food and liquids  - Passing out  - Skin becoming red around your wounds  - Change in mental status (confusion or lethargy)  - New numbness or weakness    Please return if you have any other emergent concerns.  Additional Information:  Your vital signs today were: BP 121/76    Pulse 92    Temp 98.1 F (36.7 C) (Oral)    Resp 18    Ht 5' 6.5" (1.689 m)    Wt 92.5 kg (204 lb)    LMP 12/17/2016 (Approximate)    SpO2 99%    BMI 32.43 kg/m  If your blood pressure (BP) was elevated above 135/85 this visit, please have this repeated by your doctor within one month. ---------------

## 2017-01-01 NOTE — ED Triage Notes (Signed)
Pt reports getting splinter in R foot last night, seen today by PCP who was unable to remove it.
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
## 2017-08-23 ENCOUNTER — Other Ambulatory Visit: Payer: Self-pay | Admitting: Internal Medicine

## 2017-08-23 DIAGNOSIS — R1031 Right lower quadrant pain: Secondary | ICD-10-CM

## 2017-09-08 ENCOUNTER — Other Ambulatory Visit: Payer: Self-pay

## 2017-09-17 ENCOUNTER — Other Ambulatory Visit: Payer: Self-pay

## 2017-10-24 ENCOUNTER — Emergency Department (HOSPITAL_COMMUNITY)
Admission: EM | Admit: 2017-10-24 | Discharge: 2017-10-24 | Disposition: A | Discharge status: Home / Self Care | Payer: Medicaid Other | Attending: Emergency Medicine | Admitting: Emergency Medicine

## 2017-10-24 ENCOUNTER — Other Ambulatory Visit: Payer: Self-pay

## 2017-10-24 ENCOUNTER — Encounter (HOSPITAL_COMMUNITY): Payer: Self-pay | Admitting: Emergency Medicine

## 2017-10-24 DIAGNOSIS — Z79899 Other long term (current) drug therapy: Secondary | ICD-10-CM | POA: Diagnosis not present

## 2017-10-24 DIAGNOSIS — J069 Acute upper respiratory infection, unspecified: Secondary | ICD-10-CM | POA: Insufficient documentation

## 2017-10-24 DIAGNOSIS — F319 Bipolar disorder, unspecified: Secondary | ICD-10-CM | POA: Diagnosis not present

## 2017-10-24 DIAGNOSIS — Z87891 Personal history of nicotine dependence: Secondary | ICD-10-CM | POA: Diagnosis not present

## 2017-10-24 DIAGNOSIS — N189 Chronic kidney disease, unspecified: Secondary | ICD-10-CM | POA: Insufficient documentation

## 2017-10-24 DIAGNOSIS — B9789 Other viral agents as the cause of diseases classified elsewhere: Secondary | ICD-10-CM

## 2017-10-24 DIAGNOSIS — R05 Cough: Secondary | ICD-10-CM | POA: Diagnosis not present

## 2017-10-24 DIAGNOSIS — J028 Acute pharyngitis due to other specified organisms: Secondary | ICD-10-CM

## 2017-10-24 DIAGNOSIS — J029 Acute pharyngitis, unspecified: Secondary | ICD-10-CM | POA: Insufficient documentation

## 2017-10-24 DIAGNOSIS — J45909 Unspecified asthma, uncomplicated: Secondary | ICD-10-CM | POA: Diagnosis not present

## 2017-10-24 LAB — GROUP A STREP BY PCR: Group A Strep by PCR: NOT DETECTED

## 2017-10-24 MED ORDER — ACETAMINOPHEN-CODEINE 120-12 MG/5ML PO SOLN: 10.0000 mL | ORAL | 0 refills | Status: DC | PRN

## 2017-10-24 MED ORDER — IBUPROFEN 800 MG PO TABS
800.0000 mg | ORAL_TABLET | Freq: Once | ORAL | Status: AC
  Administered 2017-10-24: 800 mg via ORAL
  Filled 2017-10-24: qty 1

## 2017-10-24 MED ORDER — GUAIFENESIN ER 1200 MG PO TB12: 1.0000 | ORAL_TABLET | Freq: Two times a day (BID) | ORAL | 0 refills | Status: DC

## 2017-10-24 MED ORDER — IBUPROFEN 800 MG PO TABS: 800.0000 mg | ORAL_TABLET | Freq: Three times a day (TID) | ORAL | 0 refills | Status: DC | PRN

## 2017-10-24 NOTE — ED Triage Notes (Signed)
Pt. Stated, I was given a strep test on Wednesday
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
## 2017-10-24 NOTE — Discharge Instructions (Addendum)
Return here as needed.  We will send your strep test off for culture and call for any positive results.  Increase your fluid intake and rest as much as possible.

## 2017-10-24 NOTE — ED Triage Notes (Signed)
Pt. Stated, [name] [name] had 2 rounds of the flu and have taken [facility] flu and I have 2 doses left of the [place] flu , my tonsil fel so swollen.

## 2018-02-28 ENCOUNTER — Ambulatory Visit (INDEPENDENT_AMBULATORY_CARE_PROVIDER_SITE_OTHER): Payer: Medicaid Other | Admitting: Otolaryngology

## 2018-07-12 ENCOUNTER — Emergency Department (HOSPITAL_COMMUNITY): Payer: Medicaid Other

## 2018-07-12 ENCOUNTER — Emergency Department (HOSPITAL_COMMUNITY)
Admission: EM | Admit: 2018-07-12 | Discharge: 2018-07-12 | Disposition: A | Discharge status: Home / Self Care | Payer: Medicaid Other | Attending: Emergency Medicine | Admitting: Emergency Medicine

## 2018-07-12 ENCOUNTER — Encounter (HOSPITAL_COMMUNITY): Payer: Self-pay | Admitting: Emergency Medicine

## 2018-07-12 ENCOUNTER — Other Ambulatory Visit: Payer: Self-pay

## 2018-07-12 DIAGNOSIS — Y998 Other external cause status: Secondary | ICD-10-CM | POA: Insufficient documentation

## 2018-07-12 DIAGNOSIS — Z79899 Other long term (current) drug therapy: Secondary | ICD-10-CM | POA: Insufficient documentation

## 2018-07-12 DIAGNOSIS — R55 Syncope and collapse: Secondary | ICD-10-CM | POA: Diagnosis not present

## 2018-07-12 DIAGNOSIS — Y9389 Activity, other specified: Secondary | ICD-10-CM | POA: Insufficient documentation

## 2018-07-12 DIAGNOSIS — J45909 Unspecified asthma, uncomplicated: Secondary | ICD-10-CM | POA: Insufficient documentation

## 2018-07-12 DIAGNOSIS — N189 Chronic kidney disease, unspecified: Secondary | ICD-10-CM | POA: Diagnosis not present

## 2018-07-12 DIAGNOSIS — F17210 Nicotine dependence, cigarettes, uncomplicated: Secondary | ICD-10-CM | POA: Insufficient documentation

## 2018-07-12 DIAGNOSIS — Y92019 Unspecified place in single-family (private) house as the place of occurrence of the external cause: Secondary | ICD-10-CM | POA: Diagnosis not present

## 2018-07-12 DIAGNOSIS — W01198A Fall on same level from slipping, tripping and stumbling with subsequent striking against other object, initial encounter: Secondary | ICD-10-CM | POA: Diagnosis not present

## 2018-07-12 DIAGNOSIS — S0990XA Unspecified injury of head, initial encounter: Secondary | ICD-10-CM | POA: Diagnosis present

## 2018-07-12 DIAGNOSIS — S069X9A Unspecified intracranial injury with loss of consciousness of unspecified duration, initial encounter: Secondary | ICD-10-CM

## 2018-07-12 LAB — I-STAT BETA HCG BLOOD, ED (MC, WL, AP ONLY)

## 2018-07-12 LAB — CBG MONITORING, ED: Glucose-Capillary: 94 mg/dL (ref 70–99)

## 2018-07-12 MED ORDER — ONDANSETRON 4 MG PO TBDP
4.0000 mg | ORAL_TABLET | Freq: Once | ORAL | Status: AC
  Administered 2018-07-12: 4 mg via ORAL
  Filled 2018-07-12: qty 1

## 2018-07-12 MED ORDER — ACETAMINOPHEN 500 MG PO TABS
1000.0000 mg | ORAL_TABLET | Freq: Once | ORAL | Status: AC
  Administered 2018-07-12: 1000 mg via ORAL
  Filled 2018-07-12: qty 2

## 2018-07-12 NOTE — Discharge Instructions (Addendum)
Your Ct imaging is normal today with no bleeding and no skull fracture.  You do have a concussion which will require rest and limited physical (and mental) activity as discussed.  Plan a recheck by your MD in one week.  Get rechecked sooner if you have any escalating symptoms.  Rest, take naps, minimize reading, studying or any activity that triggers headache.  You may take motrin or tylenol for headache relief.

## 2018-07-12 NOTE — ED Triage Notes (Signed)
Pt states she was going to answer her phone and fell d/t detergent, hitting her head. Per EMS no LOC, pt states she feels like the "lights went out for a second". Denies blood thinner use, or blurred vision c/o HA.
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
## 2018-08-28 ENCOUNTER — Emergency Department (HOSPITAL_COMMUNITY)
Admission: EM | Admit: 2018-08-28 | Discharge: 2018-08-28 | Disposition: A | Discharge status: Home / Self Care | Payer: Medicaid Other | Attending: Emergency Medicine | Admitting: Emergency Medicine

## 2018-08-28 ENCOUNTER — Other Ambulatory Visit: Payer: Self-pay

## 2018-08-28 ENCOUNTER — Encounter (HOSPITAL_COMMUNITY): Payer: Self-pay

## 2018-08-28 DIAGNOSIS — R5381 Other malaise: Secondary | ICD-10-CM | POA: Insufficient documentation

## 2018-08-28 DIAGNOSIS — R05 Cough: Secondary | ICD-10-CM | POA: Insufficient documentation

## 2018-08-28 DIAGNOSIS — R509 Fever, unspecified: Secondary | ICD-10-CM | POA: Diagnosis not present

## 2018-08-28 DIAGNOSIS — F17210 Nicotine dependence, cigarettes, uncomplicated: Secondary | ICD-10-CM | POA: Insufficient documentation

## 2018-08-28 DIAGNOSIS — M791 Myalgia, unspecified site: Secondary | ICD-10-CM | POA: Diagnosis not present

## 2018-08-28 DIAGNOSIS — J45909 Unspecified asthma, uncomplicated: Secondary | ICD-10-CM | POA: Diagnosis not present

## 2018-08-28 DIAGNOSIS — R0981 Nasal congestion: Secondary | ICD-10-CM | POA: Diagnosis present

## 2018-08-28 DIAGNOSIS — B349 Viral infection, unspecified: Secondary | ICD-10-CM | POA: Insufficient documentation

## 2018-08-28 LAB — RESPIRATORY PANEL BY PCR
Adenovirus: NOT DETECTED
Bordetella pertussis: NOT DETECTED
CHLAMYDOPHILA PNEUMONIAE-RVPPCR: NOT DETECTED
Coronavirus 229E: NOT DETECTED
Coronavirus HKU1: NOT DETECTED
Coronavirus NL63: NOT DETECTED
Coronavirus OC43: NOT DETECTED
INFLUENZA B-RVPPCR: NOT DETECTED
Influenza A: NOT DETECTED
MYCOPLASMA PNEUMONIAE-RVPPCR: NOT DETECTED
Metapneumovirus: NOT DETECTED
PARAINFLUENZA VIRUS 3-RVPPCR: NOT DETECTED
Parainfluenza Virus 1: NOT DETECTED
Parainfluenza Virus 2: NOT DETECTED
Parainfluenza Virus 4: NOT DETECTED
RESPIRATORY SYNCYTIAL VIRUS-RVPPCR: NOT DETECTED
RHINOVIRUS / ENTEROVIRUS - RVPPCR: DETECTED — AB

## 2018-08-28 LAB — INFLUENZA PANEL BY PCR (TYPE A & B)
Influenza A By PCR: NEGATIVE
Influenza B By PCR: NEGATIVE

## 2018-08-28 MED ORDER — ACETAMINOPHEN 325 MG PO TABS
650.0000 mg | ORAL_TABLET | Freq: Once | ORAL | Status: AC
  Administered 2018-08-28: 650 mg via ORAL
  Filled 2018-08-28: qty 2

## 2018-08-28 MED ORDER — CEPHALEXIN 500 MG PO CAPS: 500.0000 mg | ORAL_CAPSULE | Freq: Three times a day (TID) | ORAL | 0 refills | Status: AC

## 2018-08-28 MED ORDER — ALBUTEROL SULFATE HFA 108 (90 BASE) MCG/ACT IN AERS
2.0000 | INHALATION_SPRAY | Freq: Once | RESPIRATORY_TRACT | Status: AC
  Administered 2018-08-28: 2 via RESPIRATORY_TRACT
  Filled 2018-08-28: qty 6.7

## 2018-08-28 MED ORDER — NAPROXEN 250 MG PO TABS
500.0000 mg | ORAL_TABLET | Freq: Once | ORAL | Status: AC
  Administered 2018-08-28: 500 mg via ORAL
  Filled 2018-08-28: qty 2

## 2018-08-28 NOTE — ED Provider Notes (Signed)
[facility] Emergency Department Provider Note MRN:  [id number]  Arrival date & time: 08/28/18     Chief Complaint   flu like symptoms ("wants to be tested for cornavirus")   History of Present Illness   [name] [name] is a 33 y.o. year-old female with a history of bipolar disorder presenting to the ED with chief complaint of flulike symptoms.  Patient woke up 2 days ago with mild flulike symptoms.  Nasal congestion, mild sore throat, dry cough, general malaise, body aches, fever.  The symptoms have persisted and worsened since that time.  Concerned about what she has been reading in hearing about the coronavirus, wants to be tested.  Explains that she dropped off her father at the airport this week.  Denies headache or vision change, no neck pain, no chest pain or shortness of breath, no abdominal pain, no dysuria.  Review of Systems  A complete 10 system review of systems was obtained and all systems are negative except as noted in the HPI and PMH.   Patient's Health History    Past Medical History:  Diagnosis Date  . Asthma    ALLERGIES;IF GETS UPSET; TRIGGERS ASTHMA  . Chronic kidney disease    KIDNEY INF;WAS HOSPITALIZED X 3 DAYS  . Infection    YEAST INF;NOT FREQ  . Infection    UTI;CURRENTLY TAKING MACROBID FOR TX OF UTI  . Pregnancy induced hypertension     Past Surgical History:  Procedure Laterality Date  . WISDOM TOOTH EXTRACTION      Family History  Problem Relation Age of Onset  . Hypertension Father        DECEASED  . Diabetes Father        IDDM  . Alcohol abuse Father   . Asthma Mother   . GER disease Mother   . Seizures Brother   . Seizures Cousin        MAT 1ST COUSIN  . Diabetes Sister        HALF SISTER;ORAL MEDS  . Kidney disease Maternal Grandmother        DIALYSIS  . Asthma Brother        2 BROTHERS  . Asthma Sister        HALF SISTER  . Anesthesia problems Neg Hx     Social History   Socioeconomic History  . Marital  status: Divorced    Spouse name: Not on file  . Number of children: 1  . Years of education: 12.5  . Highest education level: Not on file  Occupational History  . Not on file  Social Needs  . Financial resource strain: Not on file  . Food insecurity:    Worry: Not on file    Inability: Not on file  . Transportation needs:    Medical: Not on file    Non-medical: Not on file  Tobacco Use  . Smoking status: Smoker, Current Status Unknown    Packs/day: 0.20    Years: 4.00    Pack years: 0.80    Types: Cigarettes    Last attempt to quit: 09/17/2011    Years since quitting: 6.9  . Smokeless tobacco: Never Used  . Tobacco comment: "pack every two weeks"  Substance and Sexual Activity  . Alcohol use: Yes    Comment: occasionally  . Drug use: No  . Sexual activity: Yes    Partners: Male    Birth control/protection: None  Lifestyle  . Physical activity:    Days  per week: Not on file    Minutes per session: Not on file  . Stress: Not on file  Relationships  . Social connections:    Talks on phone: Not on file    Gets together: Not on file    Attends religious service: Not on file    Active member of club or organization: Not on file    Attends meetings of clubs or organizations: Not on file    Relationship status: Not on file  . Intimate partner violence:    Fear of current or ex partner: Not on file    Emotionally abused: Not on file    Physically abused: Not on file    Forced sexual activity: Not on file  Other Topics Concern  . Not on file  Social History Narrative  . Not on file     Physical Exam  Vital Signs and Nursing Notes reviewed Vitals:   08/28/18 0649 08/28/18 0820  BP: (!) 146/77   Pulse: (!) 107   Resp: 18   Temp: 99.2 F (37.3 C)   SpO2: 98% 96%    CONSTITUTIONAL: Well-appearing, NAD NEURO:  Alert and oriented x 3, no focal deficits, no meningismus EYES:  eyes equal and reactive ENT/NECK:  no LAD, no JVD CARDIO: Regular rate, well-perfused,  normal S1 and S2 PULM:  CTAB no wheezing or rhonchi GI/GU:  normal bowel sounds, non-distended, non-tender MSK/SPINE:  No gross deformities, no edema SKIN:  no rash, atraumatic PSYCH:  Appropriate speech and behavior  Diagnostic and Interventional Summary    Labs Reviewed  RESPIRATORY PANEL BY PCR  INFLUENZA PANEL BY PCR (TYPE A & B)    No orders to display    Medications  albuterol (PROVENTIL HFA;VENTOLIN HFA) 108 (90 Base) MCG/ACT inhaler 2 puff (2 puffs Inhalation Given 08/28/18 0817)  naproxen (NAPROSYN) tablet 500 mg (500 mg Oral Given 08/28/18 0727)  acetaminophen (TYLENOL) tablet 650 mg (650 mg Oral Given 08/28/18 1191)     Procedures Critical Care  ED Course and Medical Decision Making  I have reviewed the triage vital signs and the nursing notes.  Pertinent labs & imaging results that were available during my care of the patient were reviewed by me and considered in my medical decision making (see below for details).  Low suspicion for COVID-19 in this healthy 33 year old female with viral syndrome.  No travel to heavily infected areas, minimal exposure to the airport recently.  Will obtain influenza and normal respiratory viral panel in an attempt to find an underlying cause.  Clinical Course as of Aug 27 856  Sun Aug 28, 2018  0713 Patient also reports that her 21-year-old daughter has had a viral illness for the past few days.  Daughter did not accompany her to the airport.   [MB]  [phone] According to CDC recommendations, testing for the novel coronavirus would be more appropriate for patient who has had close contact with the laboratory confirmed COVID-19 patient within 14 days of symptom onset, or travel to countries such as [place], [place], [place], [place], [place].  None of this applies to this patient.   [MB]    Clinical Course User Index [MB] Sabas Sous, MD    Flu panel negative, RVP will take 2 to 3 days for results according to lab.  Patient feeling much  better.  Requesting swab for strep throat.  Patient has no significant tonsillar edema or erythema, no asymmetry, no exudate, when considering the other symptoms of cough, nasal  congestion, the concern for strep throat is very low.  Instead patient encouraged to treat her symptoms with NSAIDs at home, provided with prescription for Keflex (penicillin allergy) to be taken in 2 to 3 days if her sore throat does not improve.  After the discussed management above, the patient was determined to be safe for discharge.  The patient was in agreement with this plan and all questions regarding their care were answered.  ED return precautions were discussed and the patient will return to the ED with any significant worsening of condition.  Elmer Sow. Pilar Plate, MD [facility] Emergency Medicine [facility] [email or web address]  Final Clinical Impressions(s) / ED Diagnoses     ICD-10-CM   1. Viral illness B34.9     ED Discharge Orders         Ordered    cephALEXin (KEFLEX) 500 MG capsule  3 times daily     08/28/18 0856             Sabas Sous, MD 08/28/18 0900

## 2018-08-28 NOTE — ED Notes (Signed)
Given water with medicine.   No difficulties swallowing.

## 2018-08-28 NOTE — ED Notes (Signed)
Room #10 was tested for negative pressure and passed prior to pt arrival.

## 2018-08-28 NOTE — Discharge Instructions (Addendum)
You were evaluated in the Emergency Department and after careful evaluation, we did not find any emergent condition requiring admission or further testing in the hospital.  Your symptoms today seem to be due to a viral illness.  Your lack of exposure to certain people or areas makes it extremely unlikely that you have the novel coronavirus.  If your sore throat does not improve in 2-3 days you can take the antibiotic provided to treat for strep throat.  Please return to the Emergency Department if you experience any worsening of your condition.  We encourage you to follow up with a primary care provider.  Thank you for allowing [place] to be a part of your care.

## 2018-08-28 NOTE — ED Triage Notes (Signed)
Pt reports fever, cough, runny nose, "mild asthma" and sore throat that started yesterday. Pt reports fever at home 102. 7 Oral temp in triage is 99.4 Pt says she thinks she may have been exposed b/c she has been [place] and [place] the weekend- pt says she was transporting a friend to the airport.

## 2019-04-21 ENCOUNTER — Other Ambulatory Visit: Payer: Self-pay

## 2019-04-21 DIAGNOSIS — Z20822 Contact with and (suspected) exposure to covid-19: Secondary | ICD-10-CM

## 2019-04-24 LAB — NOVEL CORONAVIRUS, NAA: SARS-CoV-2, NAA: NOT DETECTED

## 2019-05-17 ENCOUNTER — Other Ambulatory Visit (HOSPITAL_COMMUNITY): Payer: Self-pay | Admitting: Obstetrics and Gynecology

## 2019-05-17 ENCOUNTER — Other Ambulatory Visit: Payer: Self-pay | Admitting: Obstetrics and Gynecology

## 2019-05-17 DIAGNOSIS — Z3490 Encounter for supervision of normal pregnancy, unspecified, unspecified trimester: Secondary | ICD-10-CM

## 2019-05-19 ENCOUNTER — Encounter (HOSPITAL_COMMUNITY): Payer: Self-pay

## 2019-05-19 ENCOUNTER — Other Ambulatory Visit (HOSPITAL_COMMUNITY): Payer: Self-pay | Admitting: Obstetrics and Gynecology

## 2019-05-19 ENCOUNTER — Ambulatory Visit (HOSPITAL_COMMUNITY): Admission: RE | Admit: 2019-05-19 | Payer: Medicaid Other | Source: Ambulatory Visit

## 2019-05-19 DIAGNOSIS — O2630 Retained intrauterine contraceptive device in pregnancy, unspecified trimester: Secondary | ICD-10-CM

## 2019-05-24 ENCOUNTER — Other Ambulatory Visit: Payer: Self-pay | Admitting: Obstetrics and Gynecology

## 2019-05-24 ENCOUNTER — Other Ambulatory Visit: Payer: Self-pay

## 2019-05-24 ENCOUNTER — Encounter (HOSPITAL_COMMUNITY): Payer: Self-pay

## 2019-05-24 ENCOUNTER — Ambulatory Visit
Admission: RE | Admit: 2019-05-24 | Discharge: 2019-05-24 | Disposition: A | Discharge status: Home / Self Care | Payer: Medicaid Other | Source: Ambulatory Visit | Attending: Obstetrics and Gynecology | Admitting: Obstetrics and Gynecology

## 2019-05-24 ENCOUNTER — Ambulatory Visit (HOSPITAL_COMMUNITY): Admission: RE | Admit: 2019-05-24 | Payer: Medicaid Other | Source: Ambulatory Visit

## 2019-05-24 DIAGNOSIS — Z30431 Encounter for routine checking of intrauterine contraceptive device: Secondary | ICD-10-CM

## 2019-05-31 ENCOUNTER — Telehealth: Payer: Self-pay | Admitting: Obstetrics and Gynecology

## 2019-05-31 NOTE — Telephone Encounter (Signed)
Attempted to contact to get her appointment rescheduled due to her needing to see an MD. No answer, and unable to leave a voicemail due to it being full. Reminder mailed with new appointment and mychart message sent

## 2019-06-01 ENCOUNTER — Encounter: Payer: Medicaid Other | Admitting: Obstetrics and Gynecology

## 2019-06-20 ENCOUNTER — Encounter: Payer: Self-pay | Admitting: Family Medicine

## 2019-06-20 ENCOUNTER — Encounter: Payer: Medicaid Other | Admitting: Family Medicine

## 2019-06-23 NOTE — L&D Delivery Note (Signed)
Delivery Note  Patient Name: [name] [name] DOB: 1985/10/22 MRN: [id number]  Date of admission: 12/28/2019 Delivering MD: Dorisann Frames K  Date of delivery: 12/29/2019 Type of delivery: SVD  Newborn Data: Live born female  Birth Weight:  pending APGAR: 9, 9  Newborn Delivery   Birth date/time: 12/29/2019 05:49:00 Delivery type: Vaginal, Spontaneous     [name] [name], 33 y.o., @ [redacted]w[redacted]d,  [phone], who was admitted for Early latent labor. She was augmented with Pitocin. I was called to the room when she progressed +2 station in the second stage of labor.  She pushed for 1 min.  She delivered a viable infant, cephalic and restituted to the ROA position over an intact perineum.  A nuchal cord was not identified. The baby was placed on maternal abdomen while initial step of NRP were perfmored (Dry, Stimulated, and warmed). Hat placed on baby for thermoregulation. Delayed cord clamping was performed for 2 minutes.  Cord double clamped and cut.  Cord cut by the grandmother. Apgar scores were 9 and 9. Prophylactic Pitocin was started in the third stage of labor for active management. The placenta delivered spontaneously, shultz, with a 3 vessel cord and was sent to LD.  Inspection revealed none. An examination of the vaginal vault and cervix was free from lacerations. The uterus was firm, bleeding stable. Placenta and umbilical artery blood gas were not sent.  There were no complications during the procedure.  Mom and baby skin to skin following delivery. Left in stable condition.  Maternal Info: Anesthesia:Epidural Episiotomy: N/A Lacerations:  None Suture Repair: N/A Est. Blood Loss (mL):  75cc  Newborn Info: Baby Sex: female Babies Name: Julianna APGAR (1 MIN): 9   APGAR (5 MINS): 9    Mom to postpartum.  Baby to Couplet care / Skin to Skin.  Delivery Report:  Review the Delivery Report for details.     Signed: Clancy Gourd, MSN 12/29/2019, 6:14 AM

## 2019-07-13 ENCOUNTER — Ambulatory Visit: Payer: Medicaid Other | Attending: Internal Medicine

## 2019-07-13 DIAGNOSIS — Z20822 Contact with and (suspected) exposure to covid-19: Secondary | ICD-10-CM

## 2019-07-14 LAB — NOVEL CORONAVIRUS, NAA: SARS-CoV-2, NAA: NOT DETECTED

## 2019-07-15 ENCOUNTER — Telehealth: Payer: Self-pay | Admitting: Hematology

## 2019-07-15 NOTE — Telephone Encounter (Signed)
Pt is aware covid 19 test is neg on 07/15/2019

## 2019-07-21 DIAGNOSIS — Z3480 Encounter for supervision of other normal pregnancy, unspecified trimester: Secondary | ICD-10-CM | POA: Insufficient documentation

## 2019-07-21 HISTORY — DX: Encounter for supervision of other normal pregnancy, unspecified trimester: Z34.80

## 2019-07-25 ENCOUNTER — Other Ambulatory Visit (HOSPITAL_COMMUNITY): Payer: Self-pay | Admitting: Obstetrics and Gynecology

## 2019-07-25 DIAGNOSIS — Z3689 Encounter for other specified antenatal screening: Secondary | ICD-10-CM

## 2019-07-25 DIAGNOSIS — Z3A19 19 weeks gestation of pregnancy: Secondary | ICD-10-CM

## 2019-08-15 ENCOUNTER — Encounter: Payer: Self-pay | Admitting: Emergency Medicine

## 2019-08-15 ENCOUNTER — Emergency Department: Payer: Medicaid Other

## 2019-08-15 ENCOUNTER — Emergency Department
Admission: EM | Admit: 2019-08-15 | Discharge: 2019-08-15 | Disposition: A | Discharge status: Home / Self Care | Payer: Medicaid Other | Attending: Emergency Medicine | Admitting: Emergency Medicine

## 2019-08-15 ENCOUNTER — Other Ambulatory Visit: Payer: Self-pay

## 2019-08-15 DIAGNOSIS — O26892 Other specified pregnancy related conditions, second trimester: Secondary | ICD-10-CM | POA: Insufficient documentation

## 2019-08-15 DIAGNOSIS — O26899 Other specified pregnancy related conditions, unspecified trimester: Secondary | ICD-10-CM

## 2019-08-15 DIAGNOSIS — Z79899 Other long term (current) drug therapy: Secondary | ICD-10-CM | POA: Diagnosis not present

## 2019-08-15 DIAGNOSIS — Z3A18 18 weeks gestation of pregnancy: Secondary | ICD-10-CM | POA: Diagnosis not present

## 2019-08-15 DIAGNOSIS — O99512 Diseases of the respiratory system complicating pregnancy, second trimester: Secondary | ICD-10-CM | POA: Diagnosis not present

## 2019-08-15 DIAGNOSIS — F17210 Nicotine dependence, cigarettes, uncomplicated: Secondary | ICD-10-CM | POA: Insufficient documentation

## 2019-08-15 DIAGNOSIS — J45909 Unspecified asthma, uncomplicated: Secondary | ICD-10-CM | POA: Insufficient documentation

## 2019-08-15 DIAGNOSIS — R109 Unspecified abdominal pain: Secondary | ICD-10-CM | POA: Insufficient documentation

## 2019-08-15 DIAGNOSIS — O99332 Smoking (tobacco) complicating pregnancy, second trimester: Secondary | ICD-10-CM | POA: Insufficient documentation

## 2019-08-15 LAB — URINALYSIS, COMPLETE (UACMP) WITH MICROSCOPIC
Bacteria, UA: NONE SEEN
Bilirubin Urine: NEGATIVE
Glucose, UA: NEGATIVE mg/dL
Hgb urine dipstick: NEGATIVE
Ketones, ur: NEGATIVE mg/dL
Leukocytes,Ua: NEGATIVE
Nitrite: NEGATIVE
Protein, ur: NEGATIVE mg/dL
Specific Gravity, Urine: 1.005 (ref 1.005–1.030)
pH: 6 (ref 5.0–8.0)

## 2019-08-15 LAB — CBC WITH DIFFERENTIAL/PLATELET
Abs Immature Granulocytes: 0.04 10*3/uL (ref 0.00–0.07)
Basophils Absolute: 0 10*3/uL (ref 0.0–0.1)
Basophils Relative: 0 %
Eosinophils Absolute: 0.1 10*3/uL (ref 0.0–0.5)
Eosinophils Relative: 2 %
HCT: 34.7 % — ABNORMAL LOW (ref 36.0–46.0)
Hemoglobin: 11.8 g/dL — ABNORMAL LOW (ref 12.0–15.0)
Immature Granulocytes: 1 %
Lymphocytes Relative: 25 %
Lymphs Abs: 1.4 10*3/uL (ref 0.7–4.0)
MCH: 31.2 pg (ref 26.0–34.0)
MCHC: 34 g/dL (ref 30.0–36.0)
MCV: 91.8 fL (ref 80.0–100.0)
Monocytes Absolute: 0.4 10*3/uL (ref 0.1–1.0)
Monocytes Relative: 8 %
Neutro Abs: 3.5 10*3/uL (ref 1.7–7.7)
Neutrophils Relative %: 64 %
Platelets: 178 10*3/uL (ref 150–400)
RBC: 3.78 MIL/uL — ABNORMAL LOW (ref 3.87–5.11)
RDW: 12.3 % (ref 11.5–15.5)
WBC: 5.4 10*3/uL (ref 4.0–10.5)
nRBC: 0 % (ref 0.0–0.2)

## 2019-08-15 LAB — PREGNANCY, URINE: Preg Test, Ur: POSITIVE — AB

## 2019-08-15 LAB — BASIC METABOLIC PANEL
Anion gap: 9 (ref 5–15)
BUN: 9 mg/dL (ref 6–20)
CO2: 22 mmol/L (ref 22–32)
Calcium: 8.5 mg/dL — ABNORMAL LOW (ref 8.9–10.3)
Chloride: 106 mmol/L (ref 98–111)
Creatinine, Ser: 0.46 mg/dL (ref 0.44–1.00)
GFR calc Af Amer: >60 mL/min (ref >60)
GFR calc non Af Amer: >60 mL/min (ref >60)
Glucose, Bld: 93 mg/dL (ref 70–99)
Potassium: 3.9 mmol/L (ref 3.5–5.1)
Sodium: 137 mmol/L (ref 135–145)

## 2019-08-15 LAB — WET PREP, GENITAL
Clue Cells Wet Prep HPF POC: NONE SEEN
Sperm: NONE SEEN
Trich, Wet Prep: NONE SEEN
Yeast Wet Prep HPF POC: NONE SEEN

## 2019-08-15 MED ORDER — SODIUM CHLORIDE 0.9 % IV BOLUS
1000.0000 mL | Freq: Once | INTRAVENOUS | Status: AC
  Administered 2019-08-15: 1000 mL via INTRAVENOUS

## 2019-08-15 NOTE — ED Notes (Signed)
Dr. Marisa Severin and French Ana ED tech at bedside for pelvic exam.

## 2019-08-15 NOTE — ED Notes (Signed)
Pt stated that she gave urine sample and that blood was drawn. This RN called lab to make sure the lab had it. Spoke to Pompeys Pillar, she stated that she had the blood but not the urine sample.

## 2019-08-15 NOTE — ED Notes (Signed)
US in progress

## 2019-08-15 NOTE — ED Triage Notes (Signed)
Pt in via POV, reports being approximately [redacted] weeks pregnant, states, "I'm having contractions, I had more last night."  Reports being advised per OB to be evaluated here.  Ambulatory to triage; NAD noted at this time.

## 2019-08-15 NOTE — ED Notes (Signed)
Pt ambulatory to restroom with steady gait. Pt has about of NS bolus left. Will DC once IVF are finished.

## 2019-08-15 NOTE — ED Triage Notes (Signed)
States she is [redacted] weeks pregnant  Having some abd pain

## 2019-08-15 NOTE — Discharge Instructions (Addendum)
Return to the ER immediately for new, worsening, or persistent severe abdominal pain or contractions, vaginal bleeding or leakage of fluid, or any other new or worsening symptoms that concern you.  Contact your OB/GYN office today or tomorrow to arrange for follow-up.

## 2019-08-15 NOTE — ED Notes (Signed)
Pt ambulatory to treatment room with steady gait noted. No distress at this time.
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
## 2019-08-22 ENCOUNTER — Ambulatory Visit (HOSPITAL_COMMUNITY)
Admission: RE | Admit: 2019-08-22 | Discharge: 2019-08-22 | Disposition: A | Discharge status: Home / Self Care | Payer: Medicaid Other | Source: Ambulatory Visit | Attending: Obstetrics and Gynecology | Admitting: Obstetrics and Gynecology

## 2019-08-22 ENCOUNTER — Other Ambulatory Visit: Payer: Self-pay

## 2019-08-22 DIAGNOSIS — Z363 Encounter for antenatal screening for malformations: Secondary | ICD-10-CM | POA: Insufficient documentation

## 2019-08-22 DIAGNOSIS — Z3689 Encounter for other specified antenatal screening: Secondary | ICD-10-CM | POA: Insufficient documentation

## 2019-08-22 DIAGNOSIS — Z3A19 19 weeks gestation of pregnancy: Secondary | ICD-10-CM | POA: Diagnosis not present

## 2019-09-04 ENCOUNTER — Telehealth: Payer: Self-pay | Admitting: Licensed Clinical Social Worker

## 2019-09-04 NOTE — Telephone Encounter (Signed)
-----   Message from Ellison Carwin sent at 08/25/2019  8:06 AM EST ----- Regarding: referral [name] [name].  Hope all is well.  I would like to refer this member over for mental health services.  She reported a history of depression.  Thanks,

## 2019-09-04 NOTE — Telephone Encounter (Signed)
LCSW attempted to call patient to follow up on referral from Ellison Carwin, OBCM. Left vm.

## 2019-11-28 ENCOUNTER — Observation Stay
Admission: EM | Admit: 2019-11-28 | Discharge: 2019-11-28 | Disposition: A | Discharge status: Home / Self Care | Payer: Medicaid Other | Attending: Obstetrics & Gynecology | Admitting: Obstetrics & Gynecology

## 2019-11-28 ENCOUNTER — Other Ambulatory Visit: Payer: Self-pay

## 2019-11-28 DIAGNOSIS — R600 Localized edema: Secondary | ICD-10-CM

## 2019-11-28 DIAGNOSIS — Z880 Allergy status to penicillin: Secondary | ICD-10-CM | POA: Diagnosis not present

## 2019-11-28 DIAGNOSIS — J45909 Unspecified asthma, uncomplicated: Secondary | ICD-10-CM | POA: Insufficient documentation

## 2019-11-28 DIAGNOSIS — O1203 Gestational edema, third trimester: Secondary | ICD-10-CM | POA: Diagnosis not present

## 2019-11-28 DIAGNOSIS — Z3A33 33 weeks gestation of pregnancy: Secondary | ICD-10-CM | POA: Diagnosis not present

## 2019-11-28 DIAGNOSIS — O99343 Other mental disorders complicating pregnancy, third trimester: Secondary | ICD-10-CM | POA: Diagnosis not present

## 2019-11-28 DIAGNOSIS — Z8759 Personal history of other complications of pregnancy, childbirth and the puerperium: Secondary | ICD-10-CM | POA: Diagnosis not present

## 2019-11-28 DIAGNOSIS — R609 Edema, unspecified: Secondary | ICD-10-CM | POA: Diagnosis present

## 2019-11-28 DIAGNOSIS — F319 Bipolar disorder, unspecified: Secondary | ICD-10-CM | POA: Diagnosis not present

## 2019-11-28 DIAGNOSIS — O99513 Diseases of the respiratory system complicating pregnancy, third trimester: Secondary | ICD-10-CM | POA: Diagnosis not present

## 2019-11-28 LAB — URINE DRUG SCREEN, QUALITATIVE (ARMC ONLY)
Amphetamines, Ur Screen: NOT DETECTED
Barbiturates, Ur Screen: NOT DETECTED
Benzodiazepine, Ur Scrn: NOT DETECTED
Cannabinoid 50 Ng, Ur ~~LOC~~: NOT DETECTED
Cocaine Metabolite,Ur ~~LOC~~: NOT DETECTED
MDMA (Ecstasy)Ur Screen: NOT DETECTED
Methadone Scn, Ur: NOT DETECTED
Opiate, Ur Screen: NOT DETECTED
Phencyclidine (PCP) Ur S: NOT DETECTED
Tricyclic, Ur Screen: NOT DETECTED

## 2019-11-28 LAB — URINALYSIS, COMPLETE (UACMP) WITH MICROSCOPIC
Bilirubin Urine: NEGATIVE
Glucose, UA: NEGATIVE mg/dL
Hgb urine dipstick: NEGATIVE
Ketones, ur: 5 mg/dL — AB
Nitrite: NEGATIVE
Protein, ur: 30 mg/dL — AB
Specific Gravity, Urine: 1.026 (ref 1.005–1.030)
pH: 5 (ref 5.0–8.0)

## 2019-11-28 LAB — PROTEIN / CREATININE RATIO, URINE
Creatinine, Urine: 219 mg/dL
Protein Creatinine Ratio: 0.08 mg/mg{Cre} (ref 0.00–0.15)
Total Protein, Urine: 17 mg/dL

## 2019-11-28 MED ORDER — ACETAMINOPHEN 325 MG PO TABS: 650.0000 mg | ORAL_TABLET | ORAL | Status: DC | PRN

## 2019-11-28 MED ORDER — ONDANSETRON HCL 4 MG/2ML IJ SOLN: 4.0000 mg | Freq: Four times a day (QID) | INTRAMUSCULAR | Status: DC | PRN

## 2019-11-28 MED ORDER — LIDOCAINE HCL (PF) 1 % IJ SOLN: 30.0000 mL | INTRAMUSCULAR | Status: DC | PRN

## 2019-11-28 NOTE — Discharge Summary (Signed)
Physician Discharge Summary  Patient ID: [name] [name] MRN: [id number] DOB/AGE: [birth year] 34 y.o.  Admit date: 11/28/2019 Discharge date: 11/28/2019  Admission Diagnoses: Edema, concern for preeclampsia  Discharge Diagnoses:  Active Problems:   Edema    No preeclampsia  Discharged Condition: good  Hospital Course: Pt seen and examined with labs.  No s/sx preeclampsia.  No s/sx PTL.  Edema mild.  Plan outpatient monitoring.  Consults: None  Significant Diagnostic Studies: A NST procedure was performed with FHR monitoring and a normal baseline established, appropriate time of 20-40 minutes of evaluation, and accels >2 seen w 15x15 characteristics.  Results show a REACTIVE NST.   Treatments: none  Discharge Exam: Blood pressure 115/65, pulse 92, temperature 98.5 F (36.9 C), temperature source Oral, resp. rate 18, height 5\' 6"  (1.676 m), weight 100.7 kg, unknown if currently breastfeeding. no change fro H&P exam.  Disposition: Discharge disposition: 01-Home or Self Care       Discharge Instructions    Call MD for:   Complete by: As directed    Worsening contractions or pain; leakage of fluid; bleeding.   Diet general   Complete by: As directed    Increase activity slowly   Complete by: As directed      Allergies as of 11/28/2019      Reactions   Penicillins Rash   Pt states that she is able to take amoxicillin.       Medication List    TAKE these medications   acetaminophen-codeine 120-12 MG/5ML solution Take 10 mLs by mouth every 4 (four) hours as needed for moderate pain.   celecoxib 200 MG capsule Commonly known as: CeleBREX Take 1 capsule (200 mg total) by mouth 2 (two) times daily.   Concept DHA 53.5-38-1 MG Caps Take 1 tablet by mouth daily.   docusate sodium 100 MG capsule Commonly known as: COLACE Take 1 capsule (100 mg total) by mouth every 12 (twelve) hours.   Doxylamine-Pyridoxine 10-10 MG Tbec Commonly known as: Diclegis Take 2 tablets at  QHS.  May increase to 1 tablet in AM and 1tablet in the afternoon for total of 4 tablets daily. Take on empty stomach.   ferrous sulfate 325 (65 FE) MG tablet Commonly known as: FerrouSul Take 1 tablet (325 mg total) by mouth daily with breakfast.   Guaifenesin 1200 MG Tb12 Take 1 tablet (1,200 mg total) by mouth 2 (two) times daily.   ibuprofen 800 MG tablet Commonly known as: ADVIL Take 1 tablet (800 mg total) by mouth every 8 (eight) hours as needed.   ondansetron 4 MG disintegrating tablet Commonly known as: Zofran ODT Take 1-2 tablets (4-8 mg total) by mouth every 8 (eight) hours as needed for nausea or vomiting.   oxyCODONE-acetaminophen 5-325 MG tablet Commonly known as: PERCOCET/ROXICET Take 1-2 tablets by mouth every 4 (four) hours as needed for severe pain (moderate - severe pain).   traMADol 50 MG tablet Commonly known as: Ultram Take 1 tablet (50 mg total) by mouth every 6 (six) hours as needed for severe pain.        Signed: 03-16-1974 11/28/2019, 4:00 PM
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
## 2019-11-28 NOTE — OB Triage Note (Signed)
Pt [id number] at [redacted]w[redacted]d presents for increased swelling, SOB, occasional HA, and floaters. +FM. Denies LOF, bleeding, CTX. Gets prenatal care at [facility] in [place].

## 2019-12-04 DIAGNOSIS — F1911 Other psychoactive substance abuse, in remission: Secondary | ICD-10-CM | POA: Insufficient documentation

## 2019-12-04 HISTORY — DX: Other psychoactive substance abuse, in remission: F19.11

## 2019-12-21 ENCOUNTER — Encounter: Payer: Self-pay | Admitting: Obstetrics & Gynecology

## 2019-12-27 ENCOUNTER — Inpatient Hospital Stay (EMERGENCY_DEPARTMENT_HOSPITAL)
Admission: AD | Admit: 2019-12-27 | Discharge: 2019-12-27 | Disposition: A | Discharge status: Home / Self Care | Payer: Medicaid Other | Source: Home / Self Care | Attending: Obstetrics & Gynecology | Admitting: Obstetrics & Gynecology

## 2019-12-27 ENCOUNTER — Other Ambulatory Visit: Payer: Self-pay

## 2019-12-27 ENCOUNTER — Encounter (HOSPITAL_COMMUNITY): Payer: Self-pay | Admitting: Obstetrics & Gynecology

## 2019-12-27 DIAGNOSIS — O471 False labor at or after 37 completed weeks of gestation: Secondary | ICD-10-CM

## 2019-12-27 DIAGNOSIS — Z3A37 37 weeks gestation of pregnancy: Secondary | ICD-10-CM | POA: Diagnosis not present

## 2019-12-27 DIAGNOSIS — O479 False labor, unspecified: Secondary | ICD-10-CM

## 2019-12-27 NOTE — MAU Provider Note (Signed)
Chief Complaint:  Contractions   HPI  HPI: [name] [name] is a 34 y.o. [id number] at 60w3dwho presents to maternity admissions reporting uterine contractions.She reports good fetal movement, denies LOF, vaginal bleeding, vaginal itching/burning, urinary symptoms, h/a, dizziness, n/v, diarrhea, constipation or fever/chills.   Past Medical History: Past Medical History:  Diagnosis Date  . Asthma    ALLERGIES;IF GETS UPSET; TRIGGERS ASTHMA  . Chronic kidney disease    KIDNEY INF;WAS HOSPITALIZED X 3 DAYS  . Infection    YEAST INF;NOT FREQ  . Infection    UTI;CURRENTLY TAKING MACROBID FOR TX OF UTI  . Pregnancy induced hypertension     Past obstetric history: OB History  Gravida Para Term Preterm AB Living  6 3 3   1 3   SAB TAB Ectopic Multiple Live Births  1       3    # Outcome Date GA Lbr Len/2nd Weight Sex Delivery Anes PTL Lv  6 Current           5 Term 02/11/14 [redacted]w[redacted]d 06:34 / 00:09 3945 g [redacted]w[redacted]d EPI  LIV  4 Term 06/25/12 [redacted]w[redacted]d 06:08 / 00:13 3600 g F Vag-Spont EPI  LIV  3 SAB 2013          2 Term 09/29/05 [redacted]w[redacted]d 00:30 3799 g M Vag-Spont EPI N LIV     Birth Comments: PROTEINURIA;PREECLAMPSIA  1 Gravida             Past Surgical History: Past Surgical History:  Procedure Laterality Date  . WISDOM TOOTH EXTRACTION      Family History: Family History  Problem Relation Age of Onset  . Hypertension Father        DECEASED  . Diabetes Father        IDDM  . Alcohol abuse Father   . Asthma Mother   . GER disease Mother   . Seizures Brother   . Seizures Cousin        MAT 1ST COUSIN  . Diabetes Sister        HALF SISTER;ORAL MEDS  . Kidney disease Maternal Grandmother        DIALYSIS  . Asthma Brother        2 BROTHERS  . Asthma Sister        HALF SISTER  . Anesthesia problems Neg Hx     Social History: Social History   Tobacco Use  . Smoking status: Former Smoker    Packs/day: 0.20    Years: 4.00    Pack years: 0.80    Types: Cigarettes    Quit date:  09/17/2011    Years since quitting: 8.2  . Smokeless tobacco: Never Used  Vaping Use  . Vaping Use: Never used  Substance Use Topics  . Alcohol use: Not Currently    Comment: occasionally  . Drug use: Never    Allergies:  Allergies  Allergen Reactions  . Penicillins Rash    Pt states that she is able to take amoxicillin.     Meds:  Medications Prior to Admission  Medication Sig Dispense Refill Last Dose  . folic acid (FOLVITE) 1 MG tablet Take 1 mg by mouth daily.   12/27/2019 at Unknown time  . ondansetron (ZOFRAN ODT) 4 MG disintegrating tablet Take 1-2 tablets (4-8 mg total) by mouth every 8 (eight) hours as needed for nausea or vomiting. 30 tablet 0 12/26/2019 at Unknown time  . pantoprazole (PROTONIX) 20 MG tablet Take 20 mg by mouth daily.  12/27/2019 at Unknown time  . Prenatal Vit-Fe Fumarate-FA (MULTIVITAMIN-PRENATAL) 27-0.8 MG TABS tablet Take 1 tablet by mouth daily at 12 noon.   12/27/2019 at Unknown time  . acetaminophen-codeine 120-12 MG/5ML solution Take 10 mLs by mouth every 4 (four) hours as needed for moderate pain. (Patient not taking: Reported on 11/28/2019) 120 mL 0   . celecoxib (CELEBREX) 200 MG capsule Take 1 capsule (200 mg total) by mouth 2 (two) times daily. (Patient not taking: Reported on 11/28/2019) 60 capsule 0   . docusate sodium (COLACE) 100 MG capsule Take 1 capsule (100 mg total) by mouth every 12 (twelve) hours. (Patient not taking: Reported on 11/28/2019) 30 capsule 0   . Doxylamine-Pyridoxine (DICLEGIS) 10-10 MG TBEC Take 2 tablets at QHS.  May increase to 1 tablet in AM and 1tablet in the afternoon for total of 4 tablets daily. Take on empty stomach. (Patient not taking: Reported on 11/28/2019) 60 tablet 2   . ferrous sulfate (FERROUSUL) 325 (65 FE) MG tablet Take 1 tablet (325 mg total) by mouth daily with breakfast. (Patient not taking: Reported on 11/28/2019) 60 tablet 2   . Guaifenesin 1200 MG TB12 Take 1 tablet (1,200 mg total) by mouth 2 (two) times daily.  (Patient not taking: Reported on 11/28/2019) 20 each 0   . ibuprofen (ADVIL,MOTRIN) 800 MG tablet Take 1 tablet (800 mg total) by mouth every 8 (eight) hours as needed. (Patient not taking: Reported on 11/28/2019) 21 tablet 0   . oxyCODONE-acetaminophen (PERCOCET/ROXICET) 5-325 MG per tablet Take 1-2 tablets by mouth every 4 (four) hours as needed for severe pain (moderate - severe pain). (Patient not taking: Reported on 11/28/2019) 30 tablet 0   . Prenat-FeFum-FePo-FA-Omega 3 (CONCEPT DHA) 53.5-38-1 MG CAPS Take 1 tablet by mouth daily. 60 capsule 2   . traMADol (ULTRAM) 50 MG tablet Take 1 tablet (50 mg total) by mouth every 6 (six) hours as needed for severe pain. (Patient not taking: Reported on 11/28/2019) 20 tablet 0     I have reviewed patient's Past Medical Hx, Surgical Hx, Family Hx, Social Hx, medications and allergies.   ROS:  Review of Systems Other systems negative  Physical Exam   Patient Vitals for the past 24 hrs:  BP Temp Temp src Pulse Resp  12/27/19 0416 134/70 97.6 F (36.4 C) Oral 84 17  12/27/19 0054 127/74 -- -- 98 --  12/27/19 0036 128/77 98.1 F (36.7 C) Oral (!) 122 19   Constitutional: Well-developed, well-nourished female in no acute distress.  Cardiovascular: normal rate and rhythm Respiratory: normal effort, clear to auscultation bilaterally GI: Abd soft, non-tender, gravid appropriate for gestational age.   No rebound or guarding. MS: Extremities nontender, no edema, normal ROM Neurologic: Alert and oriented x 4.  GU: Neg CVAT.  PELVIC EXAM:  Dilation: 4 Effacement (%): 50 Cervical Position: Posterior Station: -3 Presentation: Vertex Exam by:: Wynelle Bourgeois, CNM  FHT:  Baseline 130 , moderate variability, accelerations present, no decelerations Contractions:  Irregular     Labs: No results found for this or any previous visit (from the past 24 hour(s)).    Imaging:  No results found.  MAU Course/MDM: NST reviewed, reactive We did prolonged  labor evaluation because of history of rapid labor She did not change significantly and contractions are spaced and mild.   Reviewed it is hard to predict how this labor will go Has appt this afternoon, so can get rechecked    Assessment: 1. Uterine contractions during pregnancy  Plan: Discharge home Labor precautions and fetal kick counts Follow up in Office for prenatal visits and recheck cervix   Follow-up Information    [facility] Follow up.   Specialty: Obstetrics and Gynecology Contact information: [address] [place] [address] [phone]               Pt stable at time of discharge.  Wynelle Bourgeois CNM, MSN Certified Nurse-Midwife 12/27/2019 4:19 AM

## 2019-12-27 NOTE — Discharge Instructions (Signed)
Labor and Vaginal Delivery  Vaginal delivery means that you give birth by pushing your baby out of your birth canal (vagina). A team of health care providers will help you before, during, and after vaginal delivery. Birth experiences are unique for every woman and every pregnancy, and birth experiences vary depending on where you choose to give birth. What happens when I arrive at the birth center or hospital? Once you are in labor and have been admitted into the hospital or birth center, your health care provider may:  Review your pregnancy history and any concerns that you have.  Insert an IV into one of your veins. This may be used to give you fluids and medicines.  Check your blood pressure, pulse, temperature, and heart rate (vital signs).  Check whether your bag of water (amniotic sac) has broken (ruptured).  Talk with you about your birth plan and discuss pain control options. Monitoring Your health care provider may monitor your contractions (uterine monitoring) and your baby's heart rate (fetal monitoring). You may need to be monitored:  Often, but not continuously (intermittently).  All the time or for long periods at a time (continuously). Continuous monitoring may be needed if: ? You are taking certain medicines, such as medicine to relieve pain or make your contractions stronger. ? You have pregnancy or labor complications. Monitoring may be done by:  Placing a special stethoscope or a handheld monitoring device on your abdomen to check your baby's heartbeat and to check for contractions.  Placing monitors on your abdomen (external monitors) to record your baby's heartbeat and the frequency and length of contractions.  Placing monitors inside your uterus through your vagina (internal monitors) to record your baby's heartbeat and the frequency, length, and strength of your contractions. Depending on the type of monitor, it may remain in your uterus or on your baby's head  until birth.  Telemetry. This is a type of continuous monitoring that can be done with external or internal monitors. Instead of having to stay in bed, you are able to move around during telemetry. Physical exam Your health care provider may perform frequent physical exams. This may include:  Checking how and where your baby is positioned in your uterus.  Checking your cervix to determine: ? Whether it is thinning out (effacing). ? Whether it is opening up (dilating). What happens during labor and delivery?  Normal labor and delivery is divided into the following three stages: Stage 1  This is the longest stage of labor.  This stage can last for hours or days.  Throughout this stage, you will feel contractions. Contractions generally feel mild, infrequent, and irregular at first. They get stronger, more frequent (about every 2-3 minutes), and more regular as you move through this stage.  This stage ends when your cervix is completely dilated to 4 inches (10 cm) and completely effaced. Stage 2  This stage starts once your cervix is completely effaced and dilated and lasts until the delivery of your baby.  This stage may last from 20 minutes to 2 hours.  This is the stage where you will feel an urge to push your baby out of your vagina.  You may feel stretching and burning pain, especially when the widest part of your baby's head passes through the vaginal opening (crowning).  Once your baby is delivered, the umbilical cord will be clamped and cut. This usually occurs after waiting a period of 1-2 minutes after delivery.  Your baby will be placed on your   bare chest (skin-to-skin contact) in an upright position and covered with a warm blanket. Watch your baby for feeding cues, like rooting or sucking, and help the baby to your breast for his or her first feeding. Stage 3  This stage starts immediately after the birth of your baby and ends after you deliver the placenta.  This  stage may take anywhere from 5 to 30 minutes.  After your baby has been delivered, you will feel contractions as your body expels the placenta and your uterus contracts to control bleeding. What can I expect after labor and delivery?  After labor is over, you and your baby will be monitored closely until you are ready to go home to ensure that you are both healthy. Your health care team will teach you how to care for yourself and your baby.  You and your baby will stay in the same room (rooming in) during your hospital stay. This will encourage early bonding and successful breastfeeding.  You may continue to receive fluids and medicines through an IV.  Your uterus will be checked and massaged regularly (fundal massage).  You will have some soreness and pain in your abdomen, vagina, and the area of skin between your vaginal opening and your anus (perineum).  If an incision was made near your vagina (episiotomy) or if you had some vaginal tearing during delivery, cold compresses may be placed on your episiotomy or your tear. This helps to reduce pain and swelling.  You may be given a squirt bottle to use instead of wiping when you go to the bathroom. To use the squirt bottle, follow these steps: ? Before you urinate, fill the squirt bottle with warm water. Do not use hot water. ? After you urinate, while you are sitting on the toilet, use the squirt bottle to rinse the area around your urethra and vaginal opening. This rinses away any urine and blood. ? Fill the squirt bottle with clean water every time you use the bathroom.  It is normal to have vaginal bleeding after delivery. Wear a sanitary pad for vaginal bleeding and discharge. Summary  Vaginal delivery means that you will give birth by pushing your baby out of your birth canal (vagina).  Your health care provider may monitor your contractions (uterine monitoring) and your baby's heart rate (fetal monitoring).  Your health care  provider may perform a physical exam.  Normal labor and delivery is divided into three stages.  After labor is over, you and your baby will be monitored closely until you are ready to go home. This information is not intended to replace advice given to you by your health care provider. Make sure you discuss any questions you have with your health care provider. Document Revised: 07/13/2017 Document Reviewed: 07/13/2017 Elsevier Patient Education  2020 [employer]  

## 2019-12-27 NOTE — MAU Note (Signed)
...  [name] [name] is a 34 y.o. at [redacted]w[redacted]d here in MAU reporting:  She began feeling bad around 1700. Pt reports nausea and contractions that were increasing in intensity. Pt reports later on she was walking in [place] and her CTX were getting more severe. Pt states she called her provider and was instructed to drink fluids but that if they continued in intensity to come to MAU. + FM. No VB or LOF. Last check in office on 12/21/19 she was 1-1.5  30-40%.   Pain score: 3/10 FHT: 150 initial FHR

## 2019-12-28 ENCOUNTER — Encounter (HOSPITAL_COMMUNITY): Payer: Self-pay | Admitting: Obstetrics & Gynecology

## 2019-12-28 ENCOUNTER — Other Ambulatory Visit: Payer: Self-pay

## 2019-12-28 ENCOUNTER — Inpatient Hospital Stay (HOSPITAL_COMMUNITY)
Admission: AD | Admit: 2019-12-28 | Discharge: 2019-12-31 | DRG: 807 | Disposition: A | Discharge status: Home / Self Care | Payer: Medicaid Other | Attending: Obstetrics & Gynecology | Admitting: Obstetrics & Gynecology

## 2019-12-28 ENCOUNTER — Inpatient Hospital Stay (HOSPITAL_COMMUNITY): Payer: Medicaid Other | Admitting: Anesthesiology

## 2019-12-28 DIAGNOSIS — O99344 Other mental disorders complicating childbirth: Secondary | ICD-10-CM | POA: Diagnosis present

## 2019-12-28 DIAGNOSIS — F909 Attention-deficit hyperactivity disorder, unspecified type: Secondary | ICD-10-CM | POA: Diagnosis present

## 2019-12-28 DIAGNOSIS — F419 Anxiety disorder, unspecified: Secondary | ICD-10-CM | POA: Diagnosis present

## 2019-12-28 DIAGNOSIS — O26893 Other specified pregnancy related conditions, third trimester: Secondary | ICD-10-CM | POA: Diagnosis present

## 2019-12-28 DIAGNOSIS — Z20822 Contact with and (suspected) exposure to covid-19: Secondary | ICD-10-CM | POA: Diagnosis present

## 2019-12-28 DIAGNOSIS — Z975 Presence of (intrauterine) contraceptive device: Secondary | ICD-10-CM

## 2019-12-28 DIAGNOSIS — Z3A37 37 weeks gestation of pregnancy: Secondary | ICD-10-CM | POA: Diagnosis not present

## 2019-12-28 DIAGNOSIS — A6000 Herpesviral infection of urogenital system, unspecified: Secondary | ICD-10-CM | POA: Diagnosis present

## 2019-12-28 DIAGNOSIS — O9832 Other infections with a predominantly sexual mode of transmission complicating childbirth: Secondary | ICD-10-CM | POA: Diagnosis present

## 2019-12-28 DIAGNOSIS — Z87891 Personal history of nicotine dependence: Secondary | ICD-10-CM

## 2019-12-28 LAB — ABO/RH: ABO/RH(D): O POS

## 2019-12-28 LAB — CBC
HCT: 36.1 % (ref 36.0–46.0)
Hemoglobin: 12.2 g/dL (ref 12.0–15.0)
MCH: 31.3 pg (ref 26.0–34.0)
MCHC: 33.8 g/dL (ref 30.0–36.0)
MCV: 92.6 fL (ref 80.0–100.0)
Platelets: 171 10*3/uL (ref 150–400)
RBC: 3.9 MIL/uL (ref 3.87–5.11)
RDW: 13.1 % (ref 11.5–15.5)
WBC: 8.2 10*3/uL (ref 4.0–10.5)
nRBC: 0 % (ref 0.0–0.2)

## 2019-12-28 LAB — TYPE AND SCREEN
ABO/RH(D): O POS
Antibody Screen: NEGATIVE

## 2019-12-28 LAB — SARS CORONAVIRUS 2 BY RT PCR (HOSPITAL ORDER, PERFORMED IN ~~LOC~~ HOSPITAL LAB): SARS Coronavirus 2: NEGATIVE

## 2019-12-28 MED ORDER — FENTANYL CITRATE (PF) 100 MCG/2ML IJ SOLN
50.0000 ug | INTRAMUSCULAR | Status: DC | PRN
  Administered 2019-12-28: 100 ug via INTRAVENOUS
  Filled 2019-12-28 (×4): qty 2

## 2019-12-28 MED ORDER — TERBUTALINE SULFATE 1 MG/ML IJ SOLN: 0.2500 mg | Freq: Once | INTRAMUSCULAR | Status: DC | PRN

## 2019-12-28 MED ORDER — DIPHENHYDRAMINE HCL 50 MG/ML IJ SOLN
12.5000 mg | INTRAMUSCULAR | Status: DC | PRN
  Administered 2019-12-29 (×2): 12.5 mg via INTRAVENOUS
  Filled 2019-12-28: qty 1

## 2019-12-28 MED ORDER — SODIUM CHLORIDE 0.9% FLUSH
3.0000 mL | Freq: Two times a day (BID) | INTRAVENOUS | Status: DC
  Administered 2019-12-29: 3 mL via INTRAVENOUS

## 2019-12-28 MED ORDER — LACTATED RINGERS IV SOLN: 500.0000 mL | INTRAVENOUS | Status: DC | PRN

## 2019-12-28 MED ORDER — EPHEDRINE 5 MG/ML INJ
10.0000 mg | INTRAVENOUS | Status: DC | PRN
  Filled 2019-12-28: qty 2

## 2019-12-28 MED ORDER — PHENYLEPHRINE 40 MCG/ML (10ML) SYRINGE FOR IV PUSH (FOR BLOOD PRESSURE SUPPORT)
80.0000 ug | PREFILLED_SYRINGE | INTRAVENOUS | Status: DC | PRN
  Administered 2019-12-28 (×2): 80 ug via INTRAVENOUS
  Filled 2019-12-28: qty 10

## 2019-12-28 MED ORDER — FENTANYL-BUPIVACAINE-NACL 0.5-0.125-0.9 MG/250ML-% EP SOLN
12.0000 mL/h | EPIDURAL | Status: DC | PRN
  Filled 2019-12-28: qty 250

## 2019-12-28 MED ORDER — SOD CITRATE-CITRIC ACID 500-334 MG/5ML PO SOLN: 30.0000 mL | ORAL | Status: DC | PRN

## 2019-12-28 MED ORDER — ACETAMINOPHEN 500 MG PO TABS: 1000.0000 mg | ORAL_TABLET | Freq: Four times a day (QID) | ORAL | Status: DC | PRN

## 2019-12-28 MED ORDER — SODIUM CHLORIDE 0.9% FLUSH: 3.0000 mL | INTRAVENOUS | Status: DC | PRN

## 2019-12-28 MED ORDER — LACTATED RINGERS IV SOLN: INTRAVENOUS | Status: DC

## 2019-12-28 MED ORDER — SODIUM CHLORIDE 0.9 % IV SOLN: 250.0000 mL | INTRAVENOUS | Status: DC | PRN

## 2019-12-28 MED ORDER — ONDANSETRON HCL 4 MG/2ML IJ SOLN
4.0000 mg | Freq: Four times a day (QID) | INTRAMUSCULAR | Status: DC | PRN
  Administered 2019-12-28: 4 mg via INTRAVENOUS
  Filled 2019-12-28: qty 2

## 2019-12-28 MED ORDER — PHENYLEPHRINE 40 MCG/ML (10ML) SYRINGE FOR IV PUSH (FOR BLOOD PRESSURE SUPPORT)
80.0000 ug | PREFILLED_SYRINGE | INTRAVENOUS | Status: AC | PRN
  Administered 2019-12-28 (×3): 80 ug via INTRAVENOUS
  Filled 2019-12-28 (×2): qty 10

## 2019-12-28 MED ORDER — OXYTOCIN BOLUS FROM INFUSION
333.0000 mL | Freq: Once | INTRAVENOUS | Status: AC
  Administered 2019-12-29: 333 mL via INTRAVENOUS

## 2019-12-28 MED ORDER — OXYTOCIN-SODIUM CHLORIDE 30-0.9 UT/500ML-% IV SOLN
1.0000 m[IU]/min | INTRAVENOUS | Status: DC
  Administered 2019-12-28: 2 m[IU]/min via INTRAVENOUS
  Filled 2019-12-28: qty 500

## 2019-12-28 MED ORDER — OXYTOCIN 10 UNIT/ML IJ SOLN: 10.0000 [IU] | Freq: Once | INTRAMUSCULAR | Status: DC

## 2019-12-28 MED ORDER — SODIUM CHLORIDE (PF) 0.9 % IJ SOLN
INTRAMUSCULAR | Status: DC | PRN
  Administered 2019-12-28: 12 mL/h via EPIDURAL

## 2019-12-28 MED ORDER — LIDOCAINE HCL (PF) 1 % IJ SOLN
INTRAMUSCULAR | Status: DC | PRN
  Administered 2019-12-28 – 2019-12-29 (×3): 5 mL via EPIDURAL

## 2019-12-28 MED ORDER — LIDOCAINE HCL (PF) 1 % IJ SOLN: 30.0000 mL | INTRAMUSCULAR | Status: DC | PRN

## 2019-12-28 MED ORDER — LACTATED RINGERS IV SOLN
500.0000 mL | Freq: Once | INTRAVENOUS | Status: AC
  Administered 2019-12-28: 500 mL via INTRAVENOUS

## 2019-12-28 MED ORDER — OXYTOCIN-SODIUM CHLORIDE 30-0.9 UT/500ML-% IV SOLN: 2.5000 [IU]/h | INTRAVENOUS | Status: DC

## 2019-12-28 NOTE — Anesthesia Preprocedure Evaluation (Signed)
Anesthesia Evaluation  Patient identified by MRN, date of birth, ID band Patient awake    Reviewed: Allergy & Precautions, NPO status , Patient's Chart, lab work & pertinent test results  Airway Mallampati: II  TM Distance: >3 FB Neck ROM: Full    Dental no notable dental hx. (+) Dental Advisory Given   Pulmonary former smoker,    Pulmonary exam normal        Cardiovascular negative cardio ROS Normal cardiovascular exam     Neuro/Psych PSYCHIATRIC DISORDERS Bipolar Disorder negative neurological ROS     GI/Hepatic negative GI ROS, Neg liver ROS,   Endo/Other  negative endocrine ROS  Renal/GU negative Renal ROS  negative genitourinary   Musculoskeletal negative musculoskeletal ROS (+)   Abdominal   Peds negative pediatric ROS (+)  Hematology negative hematology ROS (+)   Anesthesia Other Findings   Reproductive/Obstetrics negative OB ROS                             Anesthesia Physical Anesthesia Plan  ASA: II  Anesthesia Plan: Epidural   Post-op Pain Management:    Induction:   PONV Risk Score and Plan:   Airway Management Planned: Simple Face Mask  Additional Equipment:   Intra-op Plan:   Post-operative Plan:   Informed Consent: I have reviewed the patients History and Physical, chart, labs and discussed the procedure including the risks, benefits and alternatives for the proposed anesthesia with the patient or authorized representative who has indicated his/her understanding and acceptance.       Plan Discussed with: Anesthesiologist  Anesthesia Plan Comments:         Anesthesia Quick Evaluation

## 2019-12-28 NOTE — MAU Note (Signed)
Contractions started an hour ago. Was seen here yesterday.  Last exam was 4/50.  4th baby. No bleeding or leaking.

## 2019-12-28 NOTE — Anesthesia Procedure Notes (Signed)
Epidural Patient location during procedure: OB Start time: 12/28/2019 7:56 PM End time: 12/28/2019 8:21 PM  Staffing Anesthesiologist: Heather Roberts, MD Performed: anesthesiologist   Preanesthetic Checklist Completed: patient identified, IV checked, site marked, risks and benefits discussed, monitors and equipment checked, pre-op evaluation and timeout performed  Epidural Patient position: right lateral decubitus Prep: DuraPrep Patient monitoring: heart rate, cardiac monitor, continuous pulse ox and blood pressure Approach: midline Location: L2-L3 Injection technique: LOR saline  Needle:  Needle type: Tuohy  Needle gauge: 17 G Needle length: 9 cm Needle insertion depth: 7 cm Catheter size: 20 Guage Catheter at skin depth: 12 cm Test dose: negative and Other  Assessment Events: blood not aspirated, injection not painful, no injection resistance and negative IV test  Additional Notes Informed consent obtained prior to proceeding including risk of failure, 1% risk of PDPH, risk of minor discomfort and bruising.  Discussed rare but serious complications including epidural abscess, permanent nerve injury, epidural hematoma.  Discussed alternatives to epidural analgesia and patient desires to proceed.  Timeout performed pre-procedure verifying patient name, procedure, and platelet count.  Patient tolerated procedure well.
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
## 2019-12-28 NOTE — Progress Notes (Signed)
S: Resting after epidural. After epidural pt had significant drop in BP that required IVF bolus and 5 doses of ephedrine. FHT Category 1 throughout.BP returned to baseline. Pt states she feels much better after epidural. Partner and mother at the bedside providing support.   O: Vitals:   12/28/19 2055 12/28/19 2056 12/28/19 2100 12/28/19 2101  BP:  124/74  91/63  Pulse:  91  90  Resp:      Temp:      TempSrc:      SpO2: 100%  100%    FHT:  FHR: 125 bpm, variability: moderate,  accelerations:  Present,  decelerations:  Absent UC:   regular, every 2-4 minutes SVE:   Dilation: 4 Effacement (%): 60 Station: Ballotable Exam by:: Harmonii Karle, CNM  A / P: Protracted latent phase of labor, [redacted]w[redacted]d, GBS negative  Fetal Wellbeing:  Category I Pain Control:  Epidural Anticipated MOD:  NSVD   After consultation with Dr. Richardson Dopp, will augment labor with Pitocin. AROM when no longer ballotable.   Dr. Richardson Dopp aware of plan of care.   June Leap, CNM, MSN 12/28/2019, 9:01 PM

## 2019-12-29 ENCOUNTER — Inpatient Hospital Stay (HOSPITAL_COMMUNITY): Payer: Medicaid Other

## 2019-12-29 ENCOUNTER — Encounter (HOSPITAL_COMMUNITY): Payer: Self-pay | Admitting: Obstetrics & Gynecology

## 2019-12-29 LAB — RPR: RPR Ser Ql: NONREACTIVE

## 2019-12-29 MED ORDER — DIBUCAINE (PERIANAL) 1 % EX OINT: 1.0000 "application " | TOPICAL_OINTMENT | CUTANEOUS | Status: DC | PRN

## 2019-12-29 MED ORDER — FENTANYL CITRATE (PF) 250 MCG/5ML IJ SOLN
INTRAMUSCULAR | Status: DC | PRN
  Administered 2019-12-29 (×4): 50 ug via EPIDURAL

## 2019-12-29 MED ORDER — PRENATAL MULTIVITAMIN CH
1.0000 | ORAL_TABLET | Freq: Every day | ORAL | Status: DC
  Administered 2019-12-30 – 2019-12-31 (×2): 1 via ORAL
  Filled 2019-12-29 (×2): qty 1

## 2019-12-29 MED ORDER — DIPHENHYDRAMINE HCL 25 MG PO CAPS: 25.0000 mg | ORAL_CAPSULE | Freq: Four times a day (QID) | ORAL | Status: DC | PRN

## 2019-12-29 MED ORDER — IBUPROFEN 600 MG PO TABS
600.0000 mg | ORAL_TABLET | Freq: Four times a day (QID) | ORAL | Status: DC
  Administered 2019-12-29 – 2019-12-30 (×4): 600 mg via ORAL
  Filled 2019-12-29 (×4): qty 1

## 2019-12-29 MED ORDER — SENNOSIDES-DOCUSATE SODIUM 8.6-50 MG PO TABS
2.0000 | ORAL_TABLET | ORAL | Status: DC
  Administered 2019-12-29 – 2019-12-30 (×2): 2 via ORAL
  Filled 2019-12-29 (×2): qty 2

## 2019-12-29 MED ORDER — ONDANSETRON HCL 4 MG PO TABS
4.0000 mg | ORAL_TABLET | ORAL | Status: DC | PRN
  Administered 2019-12-29: 4 mg via ORAL
  Filled 2019-12-29: qty 1

## 2019-12-29 MED ORDER — BENZOCAINE-MENTHOL 20-0.5 % EX AERO: 1.0000 "application " | INHALATION_SPRAY | CUTANEOUS | Status: DC | PRN

## 2019-12-29 MED ORDER — BUPIVACAINE HCL (PF) 0.25 % IJ SOLN
INTRAMUSCULAR | Status: DC | PRN
  Administered 2019-12-29 (×2): 5 mL via EPIDURAL

## 2019-12-29 MED ORDER — HYDROXYZINE HCL 25 MG PO TABS
50.0000 mg | ORAL_TABLET | Freq: Three times a day (TID) | ORAL | Status: DC | PRN
  Administered 2019-12-29: 50 mg via ORAL
  Filled 2019-12-29: qty 1

## 2019-12-29 MED ORDER — ONDANSETRON HCL 4 MG/2ML IJ SOLN: 4.0000 mg | INTRAMUSCULAR | Status: DC | PRN

## 2019-12-29 MED ORDER — TETANUS-DIPHTH-ACELL PERTUSSIS 5-2.5-18.5 LF-MCG/0.5 IM SUSP
0.5000 mL | Freq: Once | INTRAMUSCULAR | Status: AC
  Administered 2019-12-31: 0.5 mL via INTRAMUSCULAR
  Filled 2019-12-29: qty 0.5

## 2019-12-29 MED ORDER — WITCH HAZEL-GLYCERIN EX PADS: 1.0000 "application " | MEDICATED_PAD | CUTANEOUS | Status: DC | PRN

## 2019-12-29 MED ORDER — ZOLPIDEM TARTRATE 5 MG PO TABS: 5.0000 mg | ORAL_TABLET | Freq: Every evening | ORAL | Status: DC | PRN

## 2019-12-29 MED ORDER — COCONUT OIL OIL: 1.0000 "application " | TOPICAL_OIL | Status: DC | PRN

## 2019-12-29 MED ORDER — SIMETHICONE 80 MG PO CHEW: 80.0000 mg | CHEWABLE_TABLET | ORAL | Status: DC | PRN

## 2019-12-29 MED ORDER — ACETAMINOPHEN 325 MG PO TABS
650.0000 mg | ORAL_TABLET | ORAL | Status: DC | PRN
  Administered 2019-12-29 – 2019-12-30 (×3): 650 mg via ORAL
  Filled 2019-12-29 (×4): qty 2

## 2019-12-29 NOTE — Plan of Care (Signed)

## 2019-12-29 NOTE — Anesthesia Postprocedure Evaluation (Signed)
Anesthesia Post Note  Patient: [name] [name]  Procedure(s) Performed: AN AD HOC LABOR EPIDURAL     Patient location during evaluation: Mother Baby Anesthesia Type: Epidural Level of consciousness: awake Pain management: satisfactory to patient Vital Signs Assessment: post-procedure vital signs reviewed and stable Respiratory status: spontaneous breathing Cardiovascular status: stable Anesthetic complications: no   No complications documented.  Last Vitals:  Vitals:   12/29/19 0830 12/29/19 0925  BP: (!) 118/53 107/64  Pulse: 91 86  Resp: (!) 22 20  Temp: 37.1 C 37.1 C  SpO2:      Last Pain:  Vitals:   12/29/19 1102  TempSrc:   PainSc: 0-No pain   Pain Goal:                   [employer]

## 2019-12-29 NOTE — Progress Notes (Signed)
Patient and support person made aware of epidural pump malfunction that occurred during labor overnight.  Accepting of situation.  Reassured the pump was taken out of service to be examined and repaired and appropriate documentation made by night shift RN.   Kittie Plater RNC-OB

## 2019-12-29 NOTE — Lactation Note (Signed)
This note was copied from a baby's chart. Lactation Consultation Note  Patient Name: [name] [name] Date: 12/29/2019   [name] [name] [name] now 31 hours old.  Mom reports did both bf and ff with her first children for 5-6 months. Mom reprots would like to bf longer with [place].   LC entered room and mom breastfeeding.  Mom leaned over baby.  Tried to reposition her.  Not very successful.  Mom was agreeable to putting pillow on moms lap to raise infant up slightly.   Urged hand expression and spoon feeding past breastfeeding.  Reviewed how hand expression and spoon feeding can help baby get more milk while she is learning and help mom get more milk. Asked mom if I could demo hand expression.  Mom agreed.  With moms hand LC pushed back towards chest wall and compressed. Mom able to hand express large drops of breastmilk.  Showed mom how to spoon feed them back to infant.  Urged mom to hand express and offer expressed mother milk from spoon instead of formula.  Mom in agreement.  Mom reports she really didn't like formula.   Urged to feed on cue and 8-12 or more times a day.  Reviewed and left [facility] handout.  Urged to call lactation as needed.   Maternal Data    Feeding Feeding Type: Breast Milk  LATCH Score                   Interventions    Lactation Tools Discussed/Used     Consult Status      Estel Tonelli Michaelle Copas 12/29/2019, 3:17 PM

## 2019-12-29 NOTE — Progress Notes (Signed)
S: SROM of clear fluid at 2155. Reports feeling increased pressure and pain. SVE unchanged. Recommended anesthesia come to room to assess pain. Anesthesia consulted and patient declines epidural bolus or replacement of epidural catheter. Pt with labored breathing and verbally repeating "I am a good girl". Mother at the bedside providing support. FOB on the couch asleep.   O: Vitals:   12/28/19 2348 12/29/19 0000 12/29/19 0031 12/29/19 0038  BP:  (!) 110/55 128/68 118/63  Pulse:  91 95 88  Resp:  14 15 16   Temp: 98.3 F (36.8 C)     TempSrc: Oral     SpO2:       FHT:  FHR: 140 bpm, variability: moderate,  accelerations:  Present,  decelerations:  Absent UC:   regular, every 2-2.5 minutes SVE:   Dilation: 5 Effacement (%): 60 Station: -2 Exam by:: [email or web address], CNM Forebag AROMed at [phone] of clear fluid IUPC placed without diffculty  A / P: Protracted latent phase, [redacted]w[redacted]d, GBS negative  Fetal Wellbeing:  Category I Pain Control:  Epidural Anticipated MOD:  NSVD   Forebag ruptured with clear fluid at 0052. IUPC placed without difficulty. Will titrate Pitocin until adequate contractions. Vistaril ordered for anxiety.   [redacted]w[redacted]d, CNM, MSN 12/29/2019, 1:04 AM

## 2019-12-29 NOTE — Lactation Note (Signed)
This note was copied from a baby's chart. Lactation Consultation Note  Patient Name: [name] [name] [id number] Date: 12/29/2019   [facility] attempted to see.  Everyone in room sleeping.  Let RN, Alfonzo Feller know.  She will let LC know a good time to visit with mom  Maternal Data    Feeding Feeding Type: Formula Nipple Type: Slow - flow  LATCH Score                   Interventions    Lactation Tools Discussed/Used     Consult Status      Deena Shaub Michaelle Copas 12/29/2019, 2:42 PM

## 2019-12-30 LAB — CBC
HCT: 30.3 % — ABNORMAL LOW (ref 36.0–46.0)
Hemoglobin: 10.1 g/dL — ABNORMAL LOW (ref 12.0–15.0)
MCH: 31 pg (ref 26.0–34.0)
MCHC: 33.3 g/dL (ref 30.0–36.0)
MCV: 92.9 fL (ref 80.0–100.0)
Platelets: 166 10*3/uL (ref 150–400)
RBC: 3.26 MIL/uL — ABNORMAL LOW (ref 3.87–5.11)
RDW: 13.1 % (ref 11.5–15.5)
WBC: 8.1 10*3/uL (ref 4.0–10.5)
nRBC: 0 % (ref 0.0–0.2)

## 2019-12-30 MED ORDER — OXYCODONE-ACETAMINOPHEN 5-325 MG PO TABS
1.0000 | ORAL_TABLET | Freq: Once | ORAL | Status: AC
  Administered 2019-12-30: 1 via ORAL
  Filled 2019-12-30: qty 1

## 2019-12-30 MED ORDER — ACETAMINOPHEN 325 MG PO TABS
650.0000 mg | ORAL_TABLET | Freq: Three times a day (TID) | ORAL | Status: DC
  Administered 2019-12-30 – 2019-12-31 (×3): 650 mg via ORAL
  Filled 2019-12-30 (×3): qty 2

## 2019-12-30 MED ORDER — IBUPROFEN 800 MG PO TABS
800.0000 mg | ORAL_TABLET | Freq: Three times a day (TID) | ORAL | Status: DC
  Administered 2019-12-30 – 2019-12-31 (×3): 800 mg via ORAL
  Filled 2019-12-30 (×3): qty 1

## 2019-12-30 NOTE — Progress Notes (Signed)
CSW acknowledged consult and attempted to meet with MOB. However, MOB was sleeping on arrival.  CSW will meet with MOB at a later time.  Reita Shindler D. Judieth Mckown, MSW, LCSWA Clinical Social Worker [phone]   

## 2019-12-30 NOTE — Progress Notes (Signed)
PPD# 1 SVD w/ intact perineum Information for the patient's newborn:  [name] [name], [name] [name] [[id number]]  female     S:   Reports feeling good Tolerating PO fluid and solids No nausea or vomiting Bleeding is light, no clots Pain controlled with PO meds Up ad lib / ambulatory / voiding w/o difficulty Feeding: Breast    O:   VS: BP 119/68   Pulse 73   Temp (!) 97.5 F (36.4 C) (Oral)   Resp 18   SpO2 97%   Breastfeeding Unknown   LABS:  Recent Labs    12/28/19 1731 12/30/19 0708  WBC 8.2 8.1  HGB 12.2 10.1*  PLT 171 166   Blood type: --/--/O POS Performed at [facility], [address], [place], [place] [address]  [id number]) Rubella:                        I&O: Intake/Output      07/09 0701 - 07/10 0700 07/10 0701 - 07/11 0700   Urine 0    Emesis/NG output 125    Blood     Total Output 125    Net -125         Urine Occurrence 1 x      Physical Exam: Alert and oriented X3 Lungs: Clear and unlabored Heart: regular rate and rhythm / no mumurs Abdomen: soft, non-tender, non-distended  Fundus: firm, non-tender Perineum: intact, no edema Lochia: appropriate Extremities: no edema, no calf pain or tenderness    A:  PPD # 1  Normal exam Breast feeding primagravida   P:  Routine post partum orders Lactation support Anticipate D/C on 12/31/19   Plan reviewed w/ Dr. Evorn Gong, MSN, CNM 12/30/2019, 9:53 AM

## 2019-12-30 NOTE — Lactation Note (Signed)
This note was copied from a baby's chart. Lactation Consultation Note  Patient Name: [name] [name] [id number] Date: 12/30/2019 Reason for consult: Follow-up assessment;Early term 37-38.6wks;Infant weight loss  LC in to visit with P4 Mom of ET infant at 21 hrs old.  Baby lat 6.5 % weight loss with 2 voids and 2 stools since birth.    Mom using cradle hold and trying to pinch her nipple and push it in baby's mouth.  Mom not using pillow support under baby, but hunching over baby.     Added pillow support and encouraged Mom to sit more upright.  Mom needing a lot of guidance to support her breast and latch baby deeply onto breast.  Baby opened wide and latched well.  Swallowing identified for Mom.  Baby fed for 15 mins. Baby fed on both breasts and nipples rounded when baby came off the breast.  Hand expression reviewed and transitional milk sprayed onto bed.  Tried to supplement baby with donor milk (per Mom's choice) and baby took only 2 ml.  Baby fussy and fell asleep.   Assisted Mom with double pumping.  Mom has was appears to be a "rusty pipe" on her right side.  Milk expressed is a light brown.  Reassured Mom that this would most likely resolve, quite normal and safe for baby to ingest the milk.  Mom expressed 18 ml.   On oral assessment, a posterior short frenulum was noted.  Baby extends her tongue well, and lateralizes it, but noted that back of tongue remains lower in mouth.  Baby was pushing bottle nipple out of her mouth.  To continue to monitor for adequate milk transfer at breast.  Mom denies painful latching (uterine cramping is intense).  No clicking or loss of suction noted when baby at the breast.  Deep jaw extensions noted, with pauses.    Plan- 1- Keep baby STS as much as possible 2- Offer breast with cues, watching that baby latches deeply to breast and Mom using good support under baby and on breast 3- Offer 10 ml by paced bottle of EBM+/donor milk  4- Pump both breasts for 15  mins, adding breast massage and hand expression to collect more milk 5- ask for help prn.   Feeding Feeding Type: Donor Breast Milk Nipple Type: Slow - flow  LATCH Score Latch: Grasps breast easily, tongue down, lips flanged, rhythmical sucking.  Audible Swallowing: Spontaneous and intermittent  Type of Nipple: Everted at rest and after stimulation  Comfort (Breast/Nipple): Soft / non-tender  Hold (Positioning): Assistance needed to correctly position infant at breast and maintain latch.  LATCH Score: 9  Interventions Interventions: Breast feeding basics reviewed;Assisted with latch;Skin to skin;Breast massage;Hand express;Breast compression;Adjust position;Support pillows;Position options;Expressed milk;DEBP  Lactation Tools Discussed/Used Tools: Pump;Flanges Flange Size: 24 Breast pump type: Double-Electric Breast Pump Pump Review: Setup, frequency, and cleaning;Milk Storage Initiated by:: Erby Pian RN IBCLC Date initiated:: 12/30/19   Consult Status Consult Status: Follow-up Date: 12/31/19 Follow-up type: In-patient    Judee Clara 12/30/2019, 6:14 PM

## 2019-12-31 MED ORDER — IBUPROFEN 800 MG PO TABS: 800.0000 mg | ORAL_TABLET | Freq: Three times a day (TID) | ORAL | 0 refills | Status: DC
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
## 2019-12-31 NOTE — Lactation Note (Signed)
This note was copied from a baby's chart. Lactation Consultation Note  Patient Name: [name] [name] [id number] Date: 12/31/2019 Reason for consult: Initial assessment;Early term 37-38.6wks   Maternal Data  Mom is G6 P4 with a 37 weeker. She is currently breastfeeding and supplementing with formula since the donor milk supply ran out. Mom provided with education for early term watching for feeding cues, supplementing with breast milk after each attempt at the breast at least 1 ounce with either formula or breast milk. Total feeding to not exceed 30 minutes for both breast and supplementation. Patient has WIC through [facility] and will contact them in am to get an electric pump. Mom instructed to watch for feeding cues and feed 8-10 times in a 24 hour period.   Mom has an appointment with Pediatrician in the am.   Feeding Feeding Type: Breast Fed  LATCH Score Latch: Grasps breast easily, tongue down, lips flanged, rhythmical sucking.  Audible Swallowing: Spontaneous and intermittent  Type of Nipple: Everted at rest and after stimulation  Comfort (Breast/Nipple): Soft / non-tender  Hold (Positioning): No assistance needed to correctly position infant at breast.  LATCH Score: 10  Interventions    Lactation Tools Discussed/Used     Consult Status      [name] Phillis  [name] 12/31/2019, 12:52 PM
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
## 2021-06-10 DIAGNOSIS — F900 Attention-deficit hyperactivity disorder, predominantly inattentive type: Secondary | ICD-10-CM | POA: Insufficient documentation

## 2021-10-06 DIAGNOSIS — Z8659 Personal history of other mental and behavioral disorders: Secondary | ICD-10-CM | POA: Insufficient documentation

## 2022-05-05 DIAGNOSIS — L210 Seborrhea capitis: Secondary | ICD-10-CM | POA: Insufficient documentation

## 2022-06-03 ENCOUNTER — Other Ambulatory Visit: Payer: Self-pay | Admitting: Family Medicine

## 2022-06-03 DIAGNOSIS — Z1231 Encounter for screening mammogram for malignant neoplasm of breast: Secondary | ICD-10-CM

## 2022-10-23 ENCOUNTER — Encounter (HOSPITAL_BASED_OUTPATIENT_CLINIC_OR_DEPARTMENT_OTHER): Payer: Self-pay

## 2022-10-23 ENCOUNTER — Emergency Department (HOSPITAL_BASED_OUTPATIENT_CLINIC_OR_DEPARTMENT_OTHER)
Admission: EM | Admit: 2022-10-23 | Discharge: 2022-10-23 | Disposition: A | Discharge status: Home / Self Care | Payer: Medicaid Other | Attending: Emergency Medicine | Admitting: Emergency Medicine

## 2022-10-23 ENCOUNTER — Other Ambulatory Visit: Payer: Self-pay

## 2022-10-23 DIAGNOSIS — N189 Chronic kidney disease, unspecified: Secondary | ICD-10-CM | POA: Diagnosis not present

## 2022-10-23 DIAGNOSIS — W228XXA Striking against or struck by other objects, initial encounter: Secondary | ICD-10-CM | POA: Diagnosis not present

## 2022-10-23 DIAGNOSIS — J45909 Unspecified asthma, uncomplicated: Secondary | ICD-10-CM | POA: Diagnosis not present

## 2022-10-23 DIAGNOSIS — S0990XA Unspecified injury of head, initial encounter: Secondary | ICD-10-CM | POA: Insufficient documentation

## 2022-10-23 MED ORDER — IBUPROFEN 600 MG PO TABS: 600.0000 mg | ORAL_TABLET | Freq: Four times a day (QID) | ORAL | 0 refills | Status: DC | PRN

## 2022-10-23 NOTE — ED Provider Notes (Signed)
EMERGENCY DEPARTMENT AT [facility] Provider Note   CSN: [id number] Arrival date & time: 10/23/22  1753     History  Chief Complaint  Patient presents with   Headache   Head Injury    [name] [name] is a 37 y.o. female.  The history is provided by the patient and medical records. No language interpreter was used.  Headache Head Injury Associated symptoms: headache      37 year old female significant history of bipolar, preeclampsia, chronic kidney disease, asthma, presenting complaining of head injury.  Patient report yesterday she was sweeping the floor and when assisted up, she struck the right side of her scalp against the edge of the TV.  She denies falling to the ground or have any loss of consciousness.  She did report some sharp pain to the affected area which was not severe.  Pain was waxing waning and this morning she still noticed some pain.  She does not endorse any confusion, nausea, vomiting, neck pain, focal numbness or focal weakness.  She denies any specific treatment tried at home.  She states her pain is currently 2 out of 10.  No light or sound sensitivity.  Home Medications Prior to Admission medications   Medication Sig Start Date End Date Taking? Authorizing Provider  ibuprofen (ADVIL) 800 MG tablet Take 1 tablet (800 mg total) by mouth 3 (three) times daily. 12/31/19   Dale [place], FNP  Prenat-FeFum-FePo-FA-Omega 3 (CONCEPT DHA) 53.5-38-1 MG CAPS Take 1 tablet by mouth daily. 12/28/13   Sherre Scarlet, CNM      Allergies    Penicillins    Review of Systems   Review of Systems  Neurological:  Positive for headaches.  All other systems reviewed and are negative.   Physical Exam Updated Vital Signs BP 124/74 (BP Location: Right Arm)   Pulse 88   Temp 98.1 F (36.7 C) (Oral)   Resp 20   Ht 5\' 6"  (1.676 m)   Wt 108.9 kg   SpO2 98%   BMI 38.74 kg/m  Physical Exam Vitals and nursing note reviewed.  Constitutional:       General: She is not in acute distress.    Appearance: She is well-developed.  HENT:     Head: Normocephalic and atraumatic.     Comments: Minimal tenderness to right parietal scalp without any crepitus bruising or overlying skin changes. Eyes:     General: No visual field deficit.    Extraocular Movements: Extraocular movements intact.     Conjunctiva/sclera: Conjunctivae normal.     Pupils: Pupils are equal, round, and reactive to light.  Neck:     Comments: No midline spine tenderness Pulmonary:     Effort: Pulmonary effort is normal.  Musculoskeletal:     Cervical back: Normal range of motion and neck supple.  Skin:    Findings: No rash.  Neurological:     Mental Status: She is alert and oriented to person, place, and time.     GCS: GCS eye subscore is 4. GCS verbal subscore is 5. GCS motor subscore is 6.     Cranial Nerves: No cranial nerve deficit, dysarthria or facial asymmetry.     Sensory: No sensory deficit.     Motor: No weakness.     Coordination: Romberg sign negative. Coordination normal.     Gait: Gait normal.  Psychiatric:        Mood and Affect: Mood normal.     ED Results / Procedures / Treatments  Labs (all labs ordered are listed, but only abnormal results are displayed) Labs Reviewed - No data to display  EKG None  Radiology No results found.  Procedures Procedures    Medications Ordered in ED Medications - No data to display  ED Course/ Medical Decision Making/ A&P                             Medical Decision Making  BP 124/74 (BP Location: Right Arm)   Pulse 88   Temp 98.1 F (36.7 C) (Oral)   Resp 20   Ht 5\' 6"  (1.676 m)   Wt 108.9 kg   SpO2 98%   BMI 38.74 kg/m   61:22 PM   37 year old female significant history of bipolar, preeclampsia, chronic kidney disease, asthma, presenting complaining of head injury.  Patient report yesterday she was sweeping the floor and when assisted up, she struck the right side of her scalp  against the edge of the TV.  She denies falling to the ground or have any loss of consciousness.  She did report some sharp pain to the affected area which was not severe.  Pain was waxing waning and this morning she still noticed some pain.  She does not endorse any confusion, nausea, vomiting, neck pain, focal numbness or focal weakness.  She denies any specific treatment tried at home.  She states her pain is currently 2 out of 10.  No light or sound sensitivity.  On exam, patient has a mild tenderness noted to the right parietal scalp but otherwise she has no focal neurodeficits and she is mentating appropriately.  She does not exhibit any concussion symptoms and she does not have any significant signs concerning for skull fracture or intracranial injury.  Imaging including head CT scan considered but not performed based on [place] CT rule.  Reassurance given.  Will provide supportive care for minor head injury.  I gave patient return precaution.  Patient does not exhibit any symptoms concerning for concussion..  Vital signs reviewed and overall reassuring.  Patient ambulate without difficulty.  She has a question appropriately.  She is stable for discharge.        Final Clinical Impression(s) / ED Diagnoses Final diagnoses:  Minor head injury, initial encounter    Rx / DC Orders ED Discharge Orders          Ordered    ibuprofen (ADVIL) 600 MG tablet  Every 6 hours PRN        10/23/22 2054              Fayrene Helper, PA-C 10/23/22 2056    Melene Plan, DO 10/23/22 2213

## 2022-10-23 NOTE — ED Triage Notes (Signed)
Patient arrives with complaints of headache due to hitting her head on a mounted TV yesterday. Patient increased pain to site (4/10).

## 2022-11-08 ENCOUNTER — Other Ambulatory Visit: Payer: Self-pay

## 2022-11-08 DIAGNOSIS — S6991XA Unspecified injury of right wrist, hand and finger(s), initial encounter: Secondary | ICD-10-CM | POA: Diagnosis present

## 2022-11-08 DIAGNOSIS — S60211A Contusion of right wrist, initial encounter: Secondary | ICD-10-CM | POA: Insufficient documentation

## 2022-11-08 DIAGNOSIS — Z5321 Procedure and treatment not carried out due to patient leaving prior to being seen by health care provider: Secondary | ICD-10-CM | POA: Diagnosis not present
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
## 2022-11-08 NOTE — ED Notes (Signed)
Pt states she does not want to stay for treatment and wants to leave, pt ambulatory without difficulty.
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
## 2022-11-09 ENCOUNTER — Emergency Department
Admission: EM | Admit: 2022-11-09 | Discharge: 2022-11-09 | Discharge status: Against Medical Advice | Payer: Medicaid Other | Attending: Emergency Medicine | Admitting: Emergency Medicine

## 2023-02-15 ENCOUNTER — Encounter (HOSPITAL_BASED_OUTPATIENT_CLINIC_OR_DEPARTMENT_OTHER): Payer: Self-pay

## 2023-02-15 ENCOUNTER — Emergency Department (HOSPITAL_BASED_OUTPATIENT_CLINIC_OR_DEPARTMENT_OTHER)
Admission: EM | Admit: 2023-02-15 | Discharge: 2023-02-15 | Disposition: A | Discharge status: Home / Self Care | Payer: MEDICAID | Attending: Emergency Medicine | Admitting: Emergency Medicine

## 2023-02-15 ENCOUNTER — Other Ambulatory Visit: Payer: Self-pay

## 2023-02-15 DIAGNOSIS — L02412 Cutaneous abscess of left axilla: Secondary | ICD-10-CM | POA: Insufficient documentation

## 2023-02-15 DIAGNOSIS — N189 Chronic kidney disease, unspecified: Secondary | ICD-10-CM | POA: Insufficient documentation

## 2023-02-15 DIAGNOSIS — J45909 Unspecified asthma, uncomplicated: Secondary | ICD-10-CM | POA: Diagnosis not present

## 2023-02-15 DIAGNOSIS — I129 Hypertensive chronic kidney disease with stage 1 through stage 4 chronic kidney disease, or unspecified chronic kidney disease: Secondary | ICD-10-CM | POA: Insufficient documentation

## 2023-02-15 DIAGNOSIS — L0291 Cutaneous abscess, unspecified: Secondary | ICD-10-CM

## 2023-02-15 MED ORDER — IBUPROFEN 800 MG PO TABS
800.0000 mg | ORAL_TABLET | Freq: Once | ORAL | Status: AC
  Administered 2023-02-15: 800 mg via ORAL
  Filled 2023-02-15: qty 1

## 2023-02-15 MED ORDER — LIDOCAINE-EPINEPHRINE (PF) 2 %-1:200000 IJ SOLN
10.0000 mL | Freq: Once | INTRAMUSCULAR | Status: AC
  Administered 2023-02-15: 10 mL
  Filled 2023-02-15: qty 20

## 2023-02-15 MED ORDER — HYDROCODONE-ACETAMINOPHEN 5-325 MG PO TABS
1.0000 | ORAL_TABLET | Freq: Once | ORAL | Status: AC
  Administered 2023-02-15: 1 via ORAL
  Filled 2023-02-15: qty 1

## 2023-02-15 MED ORDER — LIDOCAINE-EPINEPHRINE-TETRACAINE (LET) TOPICAL GEL
3.0000 mL | Freq: Once | TOPICAL | Status: AC
  Administered 2023-02-15: 3 mL via TOPICAL
  Filled 2023-02-15: qty 3

## 2023-02-15 NOTE — ED Provider Notes (Signed)
[place] EMERGENCY DEPARTMENT AT [facility] Provider Note   CSN: [id number] Arrival date & time: 02/15/23  1241     History  Chief Complaint  Patient presents with   Abscess    [name] [name] is a 37 y.o. female.   Abscess   37 year old female presents emergency department with complaints of abscess.  Patient states that she noted abscess form in her left armpit area approximately over the past week.  States that she used a new razor that she feels like was dirty/contaminated.  Reported some swelling as well as pain in her left armpit region.  Was seen by urgent care yesterday and had some drainage of the area and was placed on Bactrim at that time.  Patient reports continued pain as well as worsening swelling since being seen yesterday.  Presents due to concern for recurrence of abscess/incomplete drainage.  Denies any fever, chills, night sweats.  Does states that her tetanus vaccination was updated yesterday.  Denies any history of recurrent abscesses in axillary region or history of hidradenitis suppurativa.  Past medical history significant for CKD, hypertension, pyelonephritis, asthma  Home Medications Prior to Admission medications   Medication Sig Start Date End Date Taking? Authorizing Provider  ibuprofen (ADVIL) 600 MG tablet Take 1 tablet (600 mg total) by mouth every 6 (six) hours as needed. 10/23/22   Fayrene Helper, PA-C  Prenat-FeFum-FePo-FA-Omega 3 (CONCEPT DHA) 53.5-38-1 MG CAPS Take 1 tablet by mouth daily. 12/28/13   Sherre Scarlet, CNM      Allergies    Penicillins    Review of Systems   Review of Systems  All other systems reviewed and are negative.   Physical Exam Updated Vital Signs BP 135/89 (BP Location: Right Arm)   Pulse 90   Temp 98.3 F (36.8 C) (Oral)   Resp 18   Ht 5\' 6"  (1.676 m)   Wt 108.9 kg   SpO2 97%   BMI 38.74 kg/m  Physical Exam Vitals and nursing note reviewed.  Constitutional:      General: She is not in acute  distress.    Appearance: She is well-developed.  HENT:     Head: Normocephalic and atraumatic.  Eyes:     Conjunctiva/sclera: Conjunctivae normal.  Cardiovascular:     Rate and Rhythm: Normal rate and regular rhythm.     Heart sounds: No murmur heard. Pulmonary:     Effort: Pulmonary effort is normal. No respiratory distress.     Breath sounds: Normal breath sounds.  Abdominal:     Palpations: Abdomen is soft.     Tenderness: There is no abdominal tenderness.  Musculoskeletal:        General: No swelling.     Cervical back: Neck supple.  Skin:    General: Skin is warm and dry.     Capillary Refill: Capillary refill takes less than 2 seconds.     Comments: Patient with 3 to 4 cm in diameter palpable area of fluctuance in left axillary region.  Area tender to the touch with some mild erythematous skin changes.  Neurological:     Mental Status: She is alert.  Psychiatric:        Mood and Affect: Mood normal.     ED Results / Procedures / Treatments   Labs (all labs ordered are listed, but only abnormal results are displayed) Labs Reviewed  AEROBIC/ANAEROBIC CULTURE W GRAM STAIN (SURGICAL/DEEP WOUND)    EKG None  Radiology No results found.  Procedures .Marland KitchenIncision and  Drainage  Date/Time: 02/15/2023 3:45 PM  Performed by: Peter Garter, PA Authorized by: Peter Garter, PA   Consent:    Consent obtained:  Verbal   Consent given by:  Patient   Risks, benefits, and alternatives were discussed: yes     Risks discussed:  Pain, incomplete drainage, bleeding, damage to other organs and infection   Alternatives discussed:  No treatment, delayed treatment, alternative treatment, observation and referral Universal protocol:    Procedure explained and questions answered to patient or proxy's satisfaction: yes     Patient identity confirmed:  Verbally with patient and arm band Location:    Type:  Abscess   Size:  3.0   Location:  Upper extremity   Upper extremity  location:  Arm   Arm location: Left axilla. Sedation:    Sedation type:  None Anesthesia:    Anesthesia method:  Topical application and local infiltration   Topical anesthetic:  LET   Local anesthetic:  Lidocaine 2% WITH epi Procedure type:    Complexity:  Simple Procedure details:    Ultrasound guidance: no     Needle aspiration: no     Incision types:  Single straight   Drainage:  Bloody, purulent and serosanguinous   Drainage amount:  Moderate   Wound treatment:  Wound left open   Packing materials:  None Post-procedure details:    Procedure completion:  Tolerated well, no immediate complications     Medications Ordered in ED Medications  HYDROcodone-acetaminophen (NORCO/VICODIN) 5-325 MG per tablet 1 tablet (1 tablet Oral Given 02/15/23 1349)  lidocaine-EPINEPHrine (XYLOCAINE W/EPI) 2 %-1:200000 (PF) injection 10 mL (10 mLs Infiltration Given 02/15/23 1348)  lidocaine-EPINEPHrine-tetracaine (LET) topical gel (3 mLs Topical Given 02/15/23 1349)  ibuprofen (ADVIL) tablet 800 mg (800 mg Oral Given 02/15/23 1535)    ED Course/ Medical Decision Making/ A&P                                 Medical Decision Making Risk Prescription drug management.   This patient presents to the ED for concern of abscess, this involves an extensive number of treatment options, and is a complaint that carries with it a high risk of complications and morbidity.  The differential diagnosis includes abscess, lymphadenopathy, hidradenitis suppurativa, cellulitis, erysipelas, necrotizing fasciitis   Co morbidities that complicate the patient evaluation  See HPI   Additional history obtained:  Additional history obtained from EMR External records from outside source obtained and reviewed including hospital records   Lab Tests:  N/a   Imaging Studies ordered:  N/a   Cardiac Monitoring: / EKG:  The patient was maintained on a cardiac monitor.  I personally viewed and interpreted the  cardiac monitored which showed an underlying rhythm of: Sinus rhythm   Consultations Obtained:  N/a   Problem List / ED Course / Critical interventions / Medication management  Abscess I ordered medication including LAD, lidocaine with epinephrine, Norco   Reevaluation of the patient after these medicines showed that the patient improved I have reviewed the patients home medicines and have made adjustments as needed   Social Determinants of Health:  Cigarette use.  Denies illicit drug use.   Test / Admission - Considered:  Abscess Vitals signs within normal range and stable throughout visit. 37 year old female presents emergency department with complaints of abscess beneath left armpit.  Patient with appreciable 3 cm diameter abscess on initial exam.  Area drained  in manner as pictured above.  Given surrounding cellulitic skin changes, will recommend continuation of patient's at home antibiotics in the form of Bactrim.  Wound culture obtained per patient request and will follow for potential changing of antibiotic.  Patient without meeting of SIRS criteria so sepsis protocol is not performed.  Recommend close follow-up with primary care within 2 to 3 days for reevaluation with symptomatic therapy recommended as an AVS.  Treatment plan discussed at length with patient and she acknowledged understanding was agreeable to said plan.  Patient over well-appearing, afebrile no acute distress. Worrisome signs and symptoms were discussed with the patient, and the patient acknowledged understanding to return to the ED if noticed. Patient was stable upon discharge.          Final Clinical Impression(s) / ED Diagnoses Final diagnoses:  Abscess    Rx / DC Orders ED Discharge Orders     None         Peter Garter, [place] 02/15/23 1547    Alvira Monday, MD 02/16/23 [phone]

## 2023-02-15 NOTE — Discharge Instructions (Addendum)
Continue take antibiotics as directed.  You may take Tylenol/Motrin for pain.  You may continue warm compresses at home.  Recommend washing area gently with warm soapy water at least once daily.  Continue daily bandage changes.  Please do not hesitate to return to emergency department if there are worrisome signs and symptoms we discussed to become apparent.

## 2023-02-15 NOTE — ED Triage Notes (Signed)
Pt arrives ambulatory to ED with red raised area under left arm pit states that she had the area drained at [facility] Yesterday, has had increase in pain and swelling today. Has used heat packs and tylenol for pain control. Last dose of tylenol around 6 am.

## 2023-02-17 LAB — AEROBIC CULTURE W GRAM STAIN (SUPERFICIAL SPECIMEN)

## 2023-02-18 ENCOUNTER — Telehealth (HOSPITAL_BASED_OUTPATIENT_CLINIC_OR_DEPARTMENT_OTHER): Payer: Self-pay

## 2023-02-18 NOTE — Telephone Encounter (Signed)
Post ED Visit - Positive Culture Follow-up  Culture report reviewed by antimicrobial stewardship pharmacist: [facility] [x]  Eldridge Scot, Pharm.D, BCCCP []  Celedonio Miyamoto, [address].D., BCPS AQ-ID []  Garvin Fila, Pharm.D., BCPS []  [place]ina Pillion, Pharm.D., BCPS []  [place], [place].D., BCPS, AAHIVP []  Estella Husk, Pharm.D., BCPS, AAHIVP []  Lysle Pearl, PharmD, BCPS []  Phillips Climes, PharmD, BCPS []  Agapito Games, PharmD, BCPS []  Verlan Friends, PharmD []  Mervyn Gay, PharmD, BCPS []  Vinnie Level, PharmD  [facility] []  Len Childs, PharmD []  Greer Pickerel, PharmD []  Adalberto Cole, PharmD []  Perlie Gold, Rph []  Lonell Face) Jean Rosenthal, PharmD []  Earl Many, PharmD []  Junita Push, PharmD []  Dorna Leitz, PharmD []  Terrilee Files, PharmD []  Lynann Beaver, PharmD []  Keturah Barre, PharmD []  Loralee Pacas, PharmD []  Bernadene Person, PharmD   Positive Wound culture Treated with Bactrim, organism sensitive to the same and no further patient follow-up is required at this time.  Sandria Senter 02/18/2023, 1:06 PM

## 2023-04-26 ENCOUNTER — Ambulatory Visit: Payer: MEDICAID | Admitting: Family

## 2023-04-26 ENCOUNTER — Encounter: Payer: Self-pay | Admitting: Family

## 2023-04-26 VITALS — BP 117/73 | HR 96 | Ht 67.0 in | Wt 239.8 lb

## 2023-04-26 DIAGNOSIS — E559 Vitamin D deficiency, unspecified: Secondary | ICD-10-CM

## 2023-04-26 DIAGNOSIS — E782 Mixed hyperlipidemia: Secondary | ICD-10-CM

## 2023-04-26 DIAGNOSIS — E538 Deficiency of other specified B group vitamins: Secondary | ICD-10-CM

## 2023-04-26 DIAGNOSIS — R5383 Other fatigue: Secondary | ICD-10-CM

## 2023-04-26 DIAGNOSIS — Z0130 Encounter for examination of blood pressure without abnormal findings: Secondary | ICD-10-CM

## 2023-04-26 DIAGNOSIS — F319 Bipolar disorder, unspecified: Secondary | ICD-10-CM

## 2023-04-26 DIAGNOSIS — R7303 Prediabetes: Secondary | ICD-10-CM | POA: Diagnosis not present

## 2023-04-26 DIAGNOSIS — E039 Hypothyroidism, unspecified: Secondary | ICD-10-CM

## 2023-04-26 MED ORDER — OMEPRAZOLE 40 MG PO CPDR: 40.0000 mg | DELAYED_RELEASE_CAPSULE | Freq: Every day | ORAL | 0 refills | Status: DC

## 2023-04-26 MED ORDER — WEGOVY 0.5 MG/0.5ML ~~LOC~~ SOAJ: 0.5000 mg | SUBCUTANEOUS | 0 refills | Status: DC

## 2023-04-26 NOTE — Progress Notes (Signed)
New Patient Office Visit  Subjective    Patient ID: [name] [name], female    DOB: 1985-08-08  Age: 37 y.o. MRN: [id number]  CC: No chief complaint on file.   HPI SABRENIA KRIEBEL presents to establish care Previous Primary Care provider/office:   she does have additional concerns to discuss today.   HPI  Outpatient Encounter Medications as of 04/26/2023  Medication Sig   omeprazole (PRILOSEC) 40 MG capsule Take 1 capsule (40 mg total) by mouth daily.   Semaglutide-Weight Management (WEGOVY) 0.5 MG/0.5ML SOAJ Inject 0.5 mg into the skin once a week.   Prenat-FeFum-FePo-FA-Omega 3 (CONCEPT DHA) 53.5-38-1 MG CAPS Take 1 tablet by mouth daily. (Patient not taking: Reported on 04/26/2023)   [DISCONTINUED] ibuprofen (ADVIL) 600 MG tablet Take 1 tablet (600 mg total) by mouth every 6 (six) hours as needed.   No facility-administered encounter medications on file as of 04/26/2023.    Past Medical History:  Diagnosis Date   Asthma    ALLERGIES;IF GETS UPSET; TRIGGERS ASTHMA   Chronic kidney disease    KIDNEY INF;WAS HOSPITALIZED X 3 DAYS   Infection    YEAST INF;NOT FREQ   Infection    UTI;CURRENTLY TAKING MACROBID FOR TX OF UTI   Pregnancy induced hypertension     Past Surgical History:  Procedure Laterality Date   WISDOM TOOTH EXTRACTION      Family History  Problem Relation Age of Onset   Hypertension Father        DECEASED   Diabetes Father        IDDM   Alcohol abuse Father    Asthma Mother    GER disease Mother    Seizures Brother    Seizures Cousin        MAT 1ST COUSIN   Diabetes Sister        HALF SISTER;ORAL MEDS   Kidney disease Maternal Grandmother        DIALYSIS   Asthma Brother        2 BROTHERS   Asthma Sister        HALF SISTER   Anesthesia problems Neg Hx     Social History   Socioeconomic History   Marital status: Single    Spouse name: Not on file   Number of children: 1   Years of education: 12.5   Highest education level: Not on file   Occupational History   Not on file  Tobacco Use   Smoking status: Former    Current packs/day: 0.00    Average packs/day: 0.2 packs/day for 4.0 years (0.8 ttl pk-yrs)    Types: Cigarettes    Start date: 09/17/2007    Quit date: 09/17/2011    Years since quitting: 11.6   Smokeless tobacco: Never  Vaping Use   Vaping status: Never Used  Substance and Sexual Activity   Alcohol use: Not Currently    Comment: occasionally   Drug use: Never   Sexual activity: Yes    Partners: Male    Birth control/protection: None  Other Topics Concern   Not on file  Social History Narrative   Not on file   Social Determinants of Health   Financial Resource Strain: Not on file  Food Insecurity: Not on file  Transportation Needs: Not on file  Physical Activity: Not on file  Stress: Not on file  Social Connections: Not on file  Intimate Partner Violence: Not on file    ROS      Objective  BP 117/73   Pulse 96   Ht 5\' 7"  (1.702 m)   Wt 239 lb 12.8 oz (108.8 kg)   SpO2 99%   BMI 37.56 kg/m   Physical Exam     Assessment & Plan:   Problem List Items Addressed This Visit       Active Problems   Bipolar affective (HCC)   Other Visit Diagnoses     Other fatigue    -  Primary   Relevant Orders   CMP14+EGFR (Completed)   TSH (Completed)   CBC with Diff (Completed)   Iron, TIBC and Ferritin Panel (Completed)   Prediabetes       Relevant Orders   CMP14+EGFR (Completed)   TSH (Completed)   Hemoglobin A1c (Completed)   CBC with Diff (Completed)   Mixed hyperlipidemia       Relevant Orders   Lipid panel (Completed)   CMP14+EGFR (Completed)   TSH (Completed)   CBC with Diff (Completed)   Vitamin D deficiency, unspecified       Relevant Orders   VITAMIN D 25 Hydroxy (Vit-D Deficiency, Fractures) (Completed)   CMP14+EGFR (Completed)   TSH (Completed)   CBC with Diff (Completed)   B12 deficiency due to diet       Relevant Orders   CMP14+EGFR (Completed)   TSH  (Completed)   Vitamin B12 (Completed)   CBC with Diff (Completed)   Hypothyroidism (acquired)       Relevant Orders   CMP14+EGFR (Completed)   TSH (Completed)   CBC with Diff (Completed)      Checking labs today, will discuss results at follow up appointment.  Sending RX for semaglutide for pt. Gave her samples for the first month. Will complete the PA when available. 15   Return in about 2 weeks (around 05/10/2023) for F/U.   Total time spent: 30 minutes  Miki Kins, FNP  04/26/2023  This document may have been prepared by [facility] Voice Recognition software and as such may include unintentional dictation errors.

## 2023-04-27 LAB — CMP14+EGFR
ALT: 13 [IU]/L (ref 0–32)
AST: 14 [IU]/L (ref 0–40)
Albumin: 4.3 g/dL (ref 3.9–4.9)
Alkaline Phosphatase: 104 [IU]/L (ref 44–121)
BUN/Creatinine Ratio: 15 (ref 9–23)
BUN: 9 mg/dL (ref 6–20)
Bilirubin Total: 0.4 mg/dL (ref 0.0–1.2)
CO2: 22 mmol/L (ref 20–29)
Calcium: 8.8 mg/dL (ref 8.7–10.2)
Chloride: 105 mmol/L (ref 96–106)
Creatinine, Ser: 0.62 mg/dL (ref 0.57–1.00)
Globulin, Total: 2.4 g/dL (ref 1.5–4.5)
Glucose: 84 mg/dL (ref 70–99)
Potassium: 4.5 mmol/L (ref 3.5–5.2)
Sodium: 140 mmol/L (ref 134–144)
Total Protein: 6.7 g/dL (ref 6.0–8.5)
eGFR: 117 mL/min/{1.73_m2} (ref >59)

## 2023-04-27 LAB — CBC WITH DIFFERENTIAL/PLATELET
Basophils Absolute: 0 10*3/uL (ref 0.0–0.2)
Basos: 0 %
EOS (ABSOLUTE): 0.1 10*3/uL (ref 0.0–0.4)
Eos: 2 %
Hematocrit: 46.4 % (ref 34.0–46.6)
Hemoglobin: 15.4 g/dL (ref 11.1–15.9)
Immature Grans (Abs): 0 10*3/uL (ref 0.0–0.1)
Immature Granulocytes: 0 %
Lymphocytes Absolute: 2 10*3/uL (ref 0.7–3.1)
Lymphs: 30 %
MCH: 30.6 pg (ref 26.6–33.0)
MCHC: 33.2 g/dL (ref 31.5–35.7)
MCV: 92 fL (ref 79–97)
Monocytes Absolute: 0.4 10*3/uL (ref 0.1–0.9)
Monocytes: 6 %
Neutrophils Absolute: 4.1 10*3/uL (ref 1.4–7.0)
Neutrophils: 62 %
Platelets: 240 10*3/uL (ref 150–450)
RBC: 5.03 x10E6/uL (ref 3.77–5.28)
RDW: 12.3 % (ref 11.7–15.4)
WBC: 6.6 10*3/uL (ref 3.4–10.8)

## 2023-04-27 LAB — LIPID PANEL
Chol/HDL Ratio: 3.5 ratio (ref 0.0–4.4)
Cholesterol, Total: 143 mg/dL (ref 100–199)
HDL: 41 mg/dL (ref >39)
LDL Chol Calc (NIH): 88 mg/dL (ref 0–99)
Triglycerides: 71 mg/dL (ref 0–149)
VLDL Cholesterol Cal: 14 mg/dL (ref 5–40)

## 2023-04-27 LAB — HEMOGLOBIN A1C
Est. average glucose Bld gHb Est-mCnc: 108 mg/dL
Hgb A1c MFr Bld: 5.4 % (ref 4.8–5.6)

## 2023-04-27 LAB — IRON,TIBC AND FERRITIN PANEL
Ferritin: 100 ng/mL (ref 15–150)
Iron Saturation: 27 % (ref 15–55)
Iron: 87 ug/dL (ref 27–159)
Total Iron Binding Capacity: 320 ug/dL (ref 250–450)
UIBC: 233 ug/dL (ref 131–425)

## 2023-04-27 LAB — VITAMIN B12: Vitamin B-12: 372 pg/mL (ref 232–1245)

## 2023-04-27 LAB — VITAMIN D 25 HYDROXY (VIT D DEFICIENCY, FRACTURES): Vit D, 25-Hydroxy: 25.6 ng/mL — ABNORMAL LOW (ref 30.0–100.0)

## 2023-04-27 LAB — TSH: TSH: 1.13 u[IU]/mL (ref 0.450–4.500)

## 2023-04-27 NOTE — Progress Notes (Signed)
Sleep questionnaire: 2/10

## 2023-05-11 ENCOUNTER — Ambulatory Visit: Payer: MEDICAID | Admitting: Family

## 2023-05-11 VITALS — BP 120/80 | HR 92 | Ht 67.0 in | Wt 237.0 lb

## 2023-05-11 DIAGNOSIS — E538 Deficiency of other specified B group vitamins: Secondary | ICD-10-CM | POA: Diagnosis not present

## 2023-05-11 DIAGNOSIS — S39012A Strain of muscle, fascia and tendon of lower back, initial encounter: Secondary | ICD-10-CM | POA: Diagnosis not present

## 2023-05-11 MED ORDER — PREDNISONE 20 MG PO TABS: 40.0000 mg | ORAL_TABLET | Freq: Every day | ORAL | 0 refills | Status: DC

## 2023-05-11 MED ORDER — IBUPROFEN 600 MG PO TABS: 600.0000 mg | ORAL_TABLET | Freq: Four times a day (QID) | ORAL | 0 refills | Status: DC | PRN

## 2023-05-11 MED ORDER — LISDEXAMFETAMINE DIMESYLATE 30 MG PO CAPS: 30.0000 mg | ORAL_CAPSULE | Freq: Every day | ORAL | 0 refills | Status: DC

## 2023-05-11 MED ORDER — VITAMIN D (ERGOCALCIFEROL) 1.25 MG (50000 UNIT) PO CAPS: 50000.0000 [IU] | ORAL_CAPSULE | ORAL | 1 refills | Status: DC

## 2023-05-11 MED ORDER — CYANOCOBALAMIN 1000 MCG/ML IJ SOLN
1000.0000 ug | Freq: Once | INTRAMUSCULAR | Status: AC
Start: 2023-05-11 — End: 2023-05-11
  Administered 2023-05-11: 1000 ug via INTRAMUSCULAR

## 2023-05-11 NOTE — Progress Notes (Unsigned)
On 11/7 or 11/8, threw her back out at work.  She has been having significant pain since her last visit.   Started to feel a little bit better on Saturday night, starting to get depressed and anxious.   Was lifting a box at work, pulled out her back.  Lights have been causing headaches.

## 2023-05-16 ENCOUNTER — Encounter: Payer: Self-pay | Admitting: Family

## 2023-05-25 ENCOUNTER — Ambulatory Visit: Payer: MEDICAID | Admitting: Family

## 2023-05-26 ENCOUNTER — Other Ambulatory Visit: Payer: Self-pay | Admitting: Family

## 2023-05-27 ENCOUNTER — Ambulatory Visit: Payer: MEDICAID | Admitting: Family

## 2023-05-27 ENCOUNTER — Encounter: Payer: Self-pay | Admitting: Family

## 2023-05-27 VITALS — BP 130/80 | HR 81 | Ht 66.0 in | Wt 236.8 lb

## 2023-05-27 DIAGNOSIS — Z202 Contact with and (suspected) exposure to infections with a predominantly sexual mode of transmission: Secondary | ICD-10-CM

## 2023-05-27 DIAGNOSIS — N760 Acute vaginitis: Secondary | ICD-10-CM

## 2023-05-27 DIAGNOSIS — F411 Generalized anxiety disorder: Secondary | ICD-10-CM

## 2023-05-27 DIAGNOSIS — F902 Attention-deficit hyperactivity disorder, combined type: Secondary | ICD-10-CM

## 2023-05-27 MED ORDER — HYDROXYZINE HCL 10 MG PO TABS: 10.0000 mg | ORAL_TABLET | Freq: Four times a day (QID) | ORAL | 3 refills | Status: DC | PRN
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
## 2023-05-28 LAB — ACUTE VIRAL HEPATITIS (HAV, HBV, HCV)
HCV Ab: NONREACTIVE
Hep A IgM: NEGATIVE
Hep B C IgM: NEGATIVE
Hepatitis B Surface Ag: NEGATIVE

## 2023-05-28 LAB — TREPONEMAL ANTIBODIES, TPPA: Treponemal Antibodies, TPPA: NONREACTIVE

## 2023-05-28 LAB — HSV 1 AND 2 AB, IGG
HSV 1 Glycoprotein G Ab, IgG: REACTIVE — AB
HSV 2 IgG, Type Spec: REACTIVE — AB

## 2023-05-28 LAB — HIV ANTIBODY (ROUTINE TESTING W REFLEX): HIV Screen 4th Generation wRfx: NONREACTIVE

## 2023-05-28 LAB — HCV INTERPRETATION

## 2023-05-28 LAB — RPR W/REFLEX TO TREPSURE: RPR: NONREACTIVE

## 2023-05-29 LAB — NUSWAB VG+, HSV
Candida albicans, NAA: NEGATIVE
Candida glabrata, NAA: NEGATIVE
Chlamydia trachomatis, NAA: NEGATIVE
HSV 1 NAA: NEGATIVE
HSV 2 NAA: NEGATIVE
Neisseria gonorrhoeae, NAA: NEGATIVE
Trich vag by NAA: NEGATIVE

## 2023-06-10 ENCOUNTER — Ambulatory Visit: Payer: MEDICAID | Admitting: Family

## 2023-06-18 ENCOUNTER — Other Ambulatory Visit: Payer: Self-pay | Admitting: Family

## 2023-06-29 ENCOUNTER — Ambulatory Visit: Payer: Medicaid Other | Admitting: Family

## 2023-07-04 ENCOUNTER — Encounter: Payer: Self-pay | Admitting: Family

## 2023-07-04 DIAGNOSIS — E538 Deficiency of other specified B group vitamins: Secondary | ICD-10-CM | POA: Insufficient documentation

## 2023-07-04 NOTE — Assessment & Plan Note (Signed)
 B12 injection given in office today.  Return for next injection per provider instructions.

## 2023-07-06 ENCOUNTER — Other Ambulatory Visit: Payer: Self-pay

## 2023-07-06 ENCOUNTER — Telehealth: Payer: Self-pay | Admitting: Family

## 2023-07-06 MED ORDER — WEGOVY 1 MG/0.5ML ~~LOC~~ SOAJ: 1.0000 mg | SUBCUTANEOUS | 1 refills | Status: DC

## 2023-07-06 NOTE — Telephone Encounter (Signed)
 Patient left VM needing Wegovy 1 mg sent to [name]. Please advise.

## 2023-07-08 ENCOUNTER — Encounter: Payer: Self-pay | Admitting: Family

## 2023-07-08 ENCOUNTER — Ambulatory Visit: Payer: Medicaid Other | Admitting: Family

## 2023-07-08 VITALS — BP 104/68 | HR 94 | Ht 66.0 in | Wt 238.8 lb

## 2023-07-08 DIAGNOSIS — Z1283 Encounter for screening for malignant neoplasm of skin: Secondary | ICD-10-CM | POA: Diagnosis not present

## 2023-07-08 DIAGNOSIS — E538 Deficiency of other specified B group vitamins: Secondary | ICD-10-CM | POA: Diagnosis not present

## 2023-07-08 DIAGNOSIS — R300 Dysuria: Secondary | ICD-10-CM | POA: Diagnosis not present

## 2023-07-08 DIAGNOSIS — N760 Acute vaginitis: Secondary | ICD-10-CM | POA: Diagnosis not present

## 2023-07-08 DIAGNOSIS — R5383 Other fatigue: Secondary | ICD-10-CM | POA: Diagnosis not present

## 2023-07-08 LAB — POCT URINALYSIS DIPSTICK
Blood, UA: NEGATIVE
Glucose, UA: NEGATIVE
Nitrite, UA: NEGATIVE
Protein, UA: NEGATIVE
Spec Grav, UA: 1.02 (ref 1.010–1.025)
Urobilinogen, UA: 0.2 U/dL
pH, UA: 5.5 (ref 5.0–8.0)

## 2023-07-08 MED ORDER — WEGOVY 0.5 MG/0.5ML ~~LOC~~ SOAJ: 0.5000 mg | SUBCUTANEOUS | 1 refills | Status: DC

## 2023-07-08 MED ORDER — VITAMIN D (ERGOCALCIFEROL) 1.25 MG (50000 UNIT) PO CAPS: 50000.0000 [IU] | ORAL_CAPSULE | ORAL | 1 refills | Status: DC

## 2023-07-08 MED ORDER — CYANOCOBALAMIN 1000 MCG/ML IJ SOLN
1000.0000 ug | Freq: Once | INTRAMUSCULAR | Status: AC
Start: 2023-07-08 — End: 2023-07-08
  Administered 2023-07-08: 1000 ug via INTRAMUSCULAR
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
## 2023-07-09 LAB — CBC WITH DIFFERENTIAL/PLATELET
Basophils Absolute: 0 10*3/uL (ref 0.0–0.2)
Basos: 0 %
EOS (ABSOLUTE): 0.1 10*3/uL (ref 0.0–0.4)
Eos: 2 %
Hematocrit: 41.4 % (ref 34.0–46.6)
Hemoglobin: 13.9 g/dL (ref 11.1–15.9)
Immature Grans (Abs): 0 10*3/uL (ref 0.0–0.1)
Immature Granulocytes: 0 %
Lymphocytes Absolute: 2.5 10*3/uL (ref 0.7–3.1)
Lymphs: 35 %
MCH: 30.9 pg (ref 26.6–33.0)
MCHC: 33.6 g/dL (ref 31.5–35.7)
MCV: 92 fL (ref 79–97)
Monocytes Absolute: 0.5 10*3/uL (ref 0.1–0.9)
Monocytes: 7 %
Neutrophils Absolute: 4.1 10*3/uL (ref 1.4–7.0)
Neutrophils: 56 %
Platelets: 239 10*3/uL (ref 150–450)
RBC: 4.5 x10E6/uL (ref 3.77–5.28)
RDW: 12.4 % (ref 11.7–15.4)
WBC: 7.3 10*3/uL (ref 3.4–10.8)

## 2023-07-09 LAB — IRON,TIBC AND FERRITIN PANEL
Ferritin: 130 ng/mL (ref 15–150)
Iron Saturation: 33 % (ref 15–55)
Iron: 104 ug/dL (ref 27–159)
Total Iron Binding Capacity: 317 ug/dL (ref 250–450)
UIBC: 213 ug/dL (ref 131–425)

## 2023-07-09 LAB — VITAMIN B12: Vitamin B-12: >2000 pg/mL — ABNORMAL HIGH (ref 232–1245)

## 2023-07-09 LAB — MAGNESIUM: Magnesium: 2 mg/dL (ref 1.6–2.3)

## 2023-07-10 LAB — URINE CULTURE

## 2023-07-12 LAB — NUSWAB VAGINITIS PLUS (VG+)
Candida albicans, NAA: NEGATIVE
Candida glabrata, NAA: NEGATIVE
Chlamydia trachomatis, NAA: NEGATIVE
Neisseria gonorrhoeae, NAA: NEGATIVE
Trich vag by NAA: NEGATIVE

## 2023-07-19 ENCOUNTER — Telehealth: Payer: Self-pay | Admitting: Family

## 2023-07-19 NOTE — Telephone Encounter (Signed)
Patient left VM that she seen her test results on MyChart and think she has a bacterial infection that requires antibiotics and states that she is feeling "off". Please advise.

## 2023-07-26 NOTE — Progress Notes (Signed)
 Patient notified

## 2023-08-09 ENCOUNTER — Ambulatory Visit: Payer: Medicaid Other | Admitting: Family

## 2023-08-09 ENCOUNTER — Encounter: Payer: Self-pay | Admitting: Family

## 2023-08-09 VITALS — BP 104/70 | HR 86 | Ht 66.0 in | Wt 246.2 lb

## 2023-08-09 DIAGNOSIS — F909 Attention-deficit hyperactivity disorder, unspecified type: Secondary | ICD-10-CM | POA: Insufficient documentation

## 2023-08-09 DIAGNOSIS — J45909 Unspecified asthma, uncomplicated: Secondary | ICD-10-CM | POA: Insufficient documentation

## 2023-08-09 DIAGNOSIS — Z0130 Encounter for examination of blood pressure without abnormal findings: Secondary | ICD-10-CM

## 2023-08-09 DIAGNOSIS — L2089 Other atopic dermatitis: Secondary | ICD-10-CM

## 2023-08-09 DIAGNOSIS — F902 Attention-deficit hyperactivity disorder, combined type: Secondary | ICD-10-CM | POA: Diagnosis not present

## 2023-08-09 DIAGNOSIS — Z3490 Encounter for supervision of normal pregnancy, unspecified, unspecified trimester: Secondary | ICD-10-CM | POA: Insufficient documentation

## 2023-08-09 DIAGNOSIS — R350 Frequency of micturition: Secondary | ICD-10-CM | POA: Diagnosis not present

## 2023-08-09 DIAGNOSIS — B009 Herpesviral infection, unspecified: Secondary | ICD-10-CM

## 2023-08-09 HISTORY — DX: Encounter for supervision of normal pregnancy, unspecified, unspecified trimester: Z34.90

## 2023-08-09 HISTORY — DX: Herpesviral infection, unspecified: B00.9

## 2023-08-09 LAB — POCT URINALYSIS DIPSTICK
Bilirubin, UA: NEGATIVE
Glucose, UA: NEGATIVE
Ketones, UA: NEGATIVE
Leukocytes, UA: NEGATIVE
Nitrite, UA: NEGATIVE
Protein, UA: NEGATIVE
Spec Grav, UA: 1.025 (ref 1.010–1.025)
Urobilinogen, UA: 0.2 U/dL
pH, UA: 5.5 (ref 5.0–8.0)

## 2023-08-10 ENCOUNTER — Other Ambulatory Visit: Payer: Self-pay | Admitting: Family

## 2023-08-10 MED ORDER — FLUOCINOLONE ACETONIDE 0.025 % EX CREA: TOPICAL_CREAM | Freq: Two times a day (BID) | CUTANEOUS | 0 refills | Status: DC

## 2023-08-10 MED ORDER — LISDEXAMFETAMINE DIMESYLATE 30 MG PO CAPS: 30.0000 mg | ORAL_CAPSULE | Freq: Every day | ORAL | 0 refills | Status: DC

## 2023-08-10 MED ORDER — FLUOCINOLONE ACETONIDE SCALP 0.01 % EX OIL: 1.0000 | TOPICAL_OIL | Freq: Every day | CUTANEOUS | 1 refills | Status: DC

## 2023-08-18 ENCOUNTER — Telehealth: Payer: Self-pay | Admitting: Family

## 2023-08-18 NOTE — Telephone Encounter (Signed)
 Patient left VM that she still feels like she has a UTI. She is still having urinary urgency and feels like she needs to urinate every 15 minutes and even after urinating feels like she still could go again. Her last 2 urine specimens have been clear and she is becoming frustrated that we cannot figure out what is wrong. Any ideas of what we can do moving forward?

## 2023-08-26 DIAGNOSIS — F411 Generalized anxiety disorder: Secondary | ICD-10-CM | POA: Insufficient documentation

## 2023-08-26 NOTE — Assessment & Plan Note (Signed)
 Patient stable.  Well controlled with current therapy.   Continue current meds.

## 2023-09-04 NOTE — Assessment & Plan Note (Signed)
 B12 injection given in office today.  Return for next injection per provider instructions.

## 2023-09-06 ENCOUNTER — Ambulatory Visit: Payer: Medicaid Other | Admitting: Family

## 2023-09-09 NOTE — Telephone Encounter (Signed)
 We will be sending referral for pt

## 2023-09-10 ENCOUNTER — Other Ambulatory Visit: Payer: Self-pay

## 2023-09-10 DIAGNOSIS — N390 Urinary tract infection, site not specified: Secondary | ICD-10-CM

## 2023-10-16 ENCOUNTER — Encounter: Payer: Self-pay | Admitting: Family

## 2023-10-16 DIAGNOSIS — L2089 Other atopic dermatitis: Secondary | ICD-10-CM | POA: Insufficient documentation
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
## 2023-10-16 NOTE — Assessment & Plan Note (Signed)
 Sending steroid cream and scalp oil for patient.  She will let me know if these do not improve her issues.   Reassess at follow up .

## 2023-10-27 ENCOUNTER — Other Ambulatory Visit: Payer: Self-pay | Admitting: Family

## 2023-10-27 ENCOUNTER — Encounter: Payer: Self-pay | Admitting: Family

## 2023-10-27 ENCOUNTER — Ambulatory Visit: Admitting: Family

## 2023-10-27 VITALS — BP 115/82 | HR 81 | Ht 66.0 in | Wt 257.4 lb

## 2023-10-27 DIAGNOSIS — R7303 Prediabetes: Secondary | ICD-10-CM

## 2023-10-27 DIAGNOSIS — E782 Mixed hyperlipidemia: Secondary | ICD-10-CM

## 2023-10-27 DIAGNOSIS — E538 Deficiency of other specified B group vitamins: Secondary | ICD-10-CM

## 2023-10-27 DIAGNOSIS — E559 Vitamin D deficiency, unspecified: Secondary | ICD-10-CM | POA: Diagnosis not present

## 2023-10-27 DIAGNOSIS — E039 Hypothyroidism, unspecified: Secondary | ICD-10-CM

## 2023-10-27 DIAGNOSIS — R5383 Other fatigue: Secondary | ICD-10-CM

## 2023-10-27 DIAGNOSIS — Z30432 Encounter for removal of intrauterine contraceptive device: Secondary | ICD-10-CM

## 2023-10-27 DIAGNOSIS — R6882 Decreased libido: Secondary | ICD-10-CM

## 2023-10-27 DIAGNOSIS — L709 Acne, unspecified: Secondary | ICD-10-CM | POA: Insufficient documentation

## 2023-10-27 HISTORY — DX: Acne, unspecified: L70.9

## 2023-10-27 MED ORDER — LISDEXAMFETAMINE DIMESYLATE 30 MG PO CAPS: 30.0000 mg | ORAL_CAPSULE | Freq: Every day | ORAL | 0 refills | Status: DC

## 2023-10-27 MED ORDER — METFORMIN HCL 500 MG PO TABS: 500.0000 mg | ORAL_TABLET | Freq: Every day | ORAL | 1 refills | Status: DC

## 2023-10-28 LAB — CMP14+EGFR
ALT: 20 IU/L (ref 0–32)
AST: 17 IU/L (ref 0–40)
Albumin: 4.3 g/dL (ref 3.9–4.9)
Alkaline Phosphatase: 111 IU/L (ref 44–121)
BUN/Creatinine Ratio: 17 (ref 9–23)
BUN: 12 mg/dL (ref 6–20)
Bilirubin Total: 0.3 mg/dL (ref 0.0–1.2)
CO2: 20 mmol/L (ref 20–29)
Calcium: 8.9 mg/dL (ref 8.7–10.2)
Chloride: 105 mmol/L (ref 96–106)
Creatinine, Ser: 0.69 mg/dL (ref 0.57–1.00)
Globulin, Total: 2.5 g/dL (ref 1.5–4.5)
Glucose: 94 mg/dL (ref 70–99)
Potassium: 4.2 mmol/L (ref 3.5–5.2)
Sodium: 141 mmol/L (ref 134–144)
Total Protein: 6.8 g/dL (ref 6.0–8.5)
eGFR: 114 mL/min/{1.73_m2} (ref >59)

## 2023-10-28 LAB — FSH+PROG+E2+SHBG
Estradiol: 51.3 pg/mL
FSH: 6 m[IU]/mL
Progesterone: 1 ng/mL
Sex Hormone Binding: 49.7 nmol/L (ref 24.6–122.0)

## 2023-10-28 LAB — LIPID PANEL
Chol/HDL Ratio: 4.1 ratio (ref 0.0–4.4)
Cholesterol, Total: 146 mg/dL (ref 100–199)
HDL: 36 mg/dL — ABNORMAL LOW (ref >39)
LDL Chol Calc (NIH): 89 mg/dL (ref 0–99)
Triglycerides: 117 mg/dL (ref 0–149)
VLDL Cholesterol Cal: 21 mg/dL (ref 5–40)

## 2023-10-28 LAB — CBC WITH DIFFERENTIAL/PLATELET
Basophils Absolute: 0 10*3/uL (ref 0.0–0.2)
Basos: 0 %
EOS (ABSOLUTE): 0.1 10*3/uL (ref 0.0–0.4)
Eos: 2 %
Hematocrit: 43.1 % (ref 34.0–46.6)
Hemoglobin: 14.3 g/dL (ref 11.1–15.9)
Immature Grans (Abs): 0 10*3/uL (ref 0.0–0.1)
Immature Granulocytes: 0 %
Lymphocytes Absolute: 2.6 10*3/uL (ref 0.7–3.1)
Lymphs: 35 %
MCH: 30.6 pg (ref 26.6–33.0)
MCHC: 33.2 g/dL (ref 31.5–35.7)
MCV: 92 fL (ref 79–97)
Monocytes Absolute: 0.6 10*3/uL (ref 0.1–0.9)
Monocytes: 8 %
Neutrophils Absolute: 4.1 10*3/uL (ref 1.4–7.0)
Neutrophils: 55 %
Platelets: 240 10*3/uL (ref 150–450)
RBC: 4.67 x10E6/uL (ref 3.77–5.28)
RDW: 12.2 % (ref 11.7–15.4)
WBC: 7.4 10*3/uL (ref 3.4–10.8)

## 2023-10-28 LAB — IRON,TIBC AND FERRITIN PANEL
Ferritin: 131 ng/mL (ref 15–150)
Iron Saturation: 33 % (ref 15–55)
Iron: 101 ug/dL (ref 27–159)
Total Iron Binding Capacity: 306 ug/dL (ref 250–450)
UIBC: 205 ug/dL (ref 131–425)

## 2023-10-28 LAB — VITAMIN D 25 HYDROXY (VIT D DEFICIENCY, FRACTURES): Vit D, 25-Hydroxy: 26.5 ng/mL — ABNORMAL LOW (ref 30.0–100.0)

## 2023-10-28 LAB — HEMOGLOBIN A1C
Est. average glucose Bld gHb Est-mCnc: 108 mg/dL
Hgb A1c MFr Bld: 5.4 % (ref 4.8–5.6)

## 2023-10-28 LAB — TSH+T4F+T3FREE
Free T4: 1.15 ng/dL (ref 0.82–1.77)
T3, Free: 3.6 pg/mL (ref 2.0–4.4)
TSH: 1.86 u[IU]/mL (ref 0.450–4.500)

## 2023-10-28 LAB — VITAMIN B12: Vitamin B-12: 371 pg/mL (ref 232–1245)

## 2023-11-18 ENCOUNTER — Other Ambulatory Visit: Payer: Self-pay | Admitting: Family

## 2023-11-30 ENCOUNTER — Ambulatory Visit: Payer: MEDICAID | Admitting: Certified Nurse Midwife

## 2023-11-30 ENCOUNTER — Ambulatory Visit (INDEPENDENT_AMBULATORY_CARE_PROVIDER_SITE_OTHER): Admitting: Family

## 2023-11-30 VITALS — BP 112/75 | HR 76 | Ht 66.0 in | Wt 259.9 lb

## 2023-11-30 VITALS — BP 106/76 | HR 88 | Ht 66.0 in | Wt 260.4 lb

## 2023-11-30 DIAGNOSIS — F900 Attention-deficit hyperactivity disorder, predominantly inattentive type: Secondary | ICD-10-CM

## 2023-11-30 DIAGNOSIS — Z803 Family history of malignant neoplasm of breast: Secondary | ICD-10-CM

## 2023-11-30 DIAGNOSIS — R635 Abnormal weight gain: Secondary | ICD-10-CM

## 2023-11-30 DIAGNOSIS — Z1231 Encounter for screening mammogram for malignant neoplasm of breast: Secondary | ICD-10-CM

## 2023-11-30 DIAGNOSIS — R7303 Prediabetes: Secondary | ICD-10-CM

## 2023-11-30 DIAGNOSIS — Z30432 Encounter for removal of intrauterine contraceptive device: Secondary | ICD-10-CM | POA: Diagnosis not present

## 2023-11-30 DIAGNOSIS — R232 Flushing: Secondary | ICD-10-CM

## 2023-11-30 DIAGNOSIS — R61 Generalized hyperhidrosis: Secondary | ICD-10-CM

## 2023-11-30 DIAGNOSIS — Z0130 Encounter for examination of blood pressure without abnormal findings: Secondary | ICD-10-CM

## 2023-11-30 MED ORDER — METFORMIN HCL 500 MG PO TABS: 500.0000 mg | ORAL_TABLET | Freq: Two times a day (BID) | ORAL | 1 refills | Status: DC

## 2023-11-30 MED ORDER — LISDEXAMFETAMINE DIMESYLATE 40 MG PO CAPS: 40.0000 mg | ORAL_CAPSULE | Freq: Every day | ORAL | 0 refills | Status: DC

## 2023-11-30 NOTE — Progress Notes (Signed)
    GYNECOLOGY CLINIC PROCEDURE NOTE  [name] [name] is a 38 y.o. [phone] here for Mirena IUD removal. No GYN concerns.  She reports PCP told her Gyn would need to refer her to endocrine, reports 60# weight gain in last two years, night sweats & hot flashes & desires referral to endocrinology, reports her mother started seeing an endocrinologist around this age. Also reports mother was diagnosed with breast cancer last year & requests screening mammogram be ordered. Last pap smear was on 07/28/21 and was normal.  IUD Removal  Patient was in the dorsal lithotomy position, normal external genitalia was noted.  A speculum was placed in the patient's vagina, normal discharge was noted, no lesions. The multiparous cervix was visualized, no lesions, no abnormal discharge.  The strings of the IUD were grasped and pulled using ring forceps. The IUD was removed in its entirety. Patient tolerated the procedure well.    Patient will use withdrawal for contraception.  Routine preventative health maintenance measures emphasized.   1. Encounter for IUD removal (Primary)  2. Night sweats - Ambulatory referral to Endocrinology  3. Weight gain - Ambulatory referral to Endocrinology  4. Hot flashes - Ambulatory referral to Endocrinology  5. Breast cancer screening by mammogram - MM 3D SCREENING MAMMOGRAM BILATERAL BREAST; Future  6. Family history of breast cancer - MM 3D SCREENING MAMMOGRAM BILATERAL BREAST; Future   Forestine Igo, CNM 11/30/2023 9:33 AM

## 2023-12-09 ENCOUNTER — Ambulatory Visit: Admitting: Certified Nurse Midwife

## 2023-12-09 DIAGNOSIS — Z01419 Encounter for gynecological examination (general) (routine) without abnormal findings: Secondary | ICD-10-CM

## 2023-12-09 DIAGNOSIS — Z124 Encounter for screening for malignant neoplasm of cervix: Secondary | ICD-10-CM

## 2023-12-09 DIAGNOSIS — Z1151 Encounter for screening for human papillomavirus (HPV): Secondary | ICD-10-CM

## 2023-12-23 ENCOUNTER — Encounter: Payer: Self-pay | Admitting: Family

## 2023-12-23 ENCOUNTER — Other Ambulatory Visit: Payer: Self-pay

## 2023-12-23 MED ORDER — FLUCONAZOLE 150 MG PO TABS: 150.0000 mg | ORAL_TABLET | Freq: Every day | ORAL | 0 refills | Status: DC

## 2024-01-06 ENCOUNTER — Ambulatory Visit: Admitting: Family

## 2024-01-06 ENCOUNTER — Encounter: Payer: Self-pay | Admitting: Family

## 2024-01-06 VITALS — BP 108/78 | HR 92 | Ht 66.0 in | Wt 255.6 lb

## 2024-01-06 DIAGNOSIS — R300 Dysuria: Secondary | ICD-10-CM | POA: Diagnosis not present

## 2024-01-06 DIAGNOSIS — F900 Attention-deficit hyperactivity disorder, predominantly inattentive type: Secondary | ICD-10-CM | POA: Diagnosis not present

## 2024-01-06 DIAGNOSIS — Z0130 Encounter for examination of blood pressure without abnormal findings: Secondary | ICD-10-CM

## 2024-01-06 DIAGNOSIS — N760 Acute vaginitis: Secondary | ICD-10-CM | POA: Diagnosis not present

## 2024-01-06 LAB — POCT URINALYSIS DIPSTICK
Bilirubin, UA: NEGATIVE
Glucose, UA: NEGATIVE
Ketones, UA: NEGATIVE
Leukocytes, UA: NEGATIVE
Nitrite, UA: NEGATIVE
Protein, UA: NEGATIVE
Spec Grav, UA: >=1.03 — AB (ref 1.010–1.025)
Urobilinogen, UA: 0.2 U/dL
pH, UA: 5.5 (ref 5.0–8.0)

## 2024-01-06 MED ORDER — VYVANSE 40 MG PO CAPS: 40.0000 mg | ORAL_CAPSULE | Freq: Every day | ORAL | 0 refills | Status: DC

## 2024-01-06 MED ORDER — ZEPBOUND 5 MG/0.5ML ~~LOC~~ SOAJ: 5.0000 mg | SUBCUTANEOUS | 2 refills | Status: DC

## 2024-01-06 NOTE — Assessment & Plan Note (Signed)
 Patient stable.  Well controlled with current therapy.   Continue current meds.
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
## 2024-01-07 LAB — URINALYSIS, ROUTINE W REFLEX MICROSCOPIC
Bilirubin, UA: NEGATIVE
Glucose, UA: NEGATIVE
Ketones, UA: NEGATIVE
Leukocytes,UA: NEGATIVE
Nitrite, UA: NEGATIVE
RBC, UA: NEGATIVE
Specific Gravity, UA: >=1.03 — AB (ref 1.005–1.030)
Urobilinogen, Ur: 0.2 mg/dL (ref 0.2–1.0)
pH, UA: 6 (ref 5.0–7.5)

## 2024-01-08 LAB — URINE CULTURE

## 2024-01-09 LAB — NUSWAB VAGINITIS PLUS (VG+)
Candida albicans, NAA: NEGATIVE
Candida glabrata, NAA: NEGATIVE
Chlamydia trachomatis, NAA: NEGATIVE
Neisseria gonorrhoeae, NAA: NEGATIVE
Trich vag by NAA: POSITIVE — AB

## 2024-01-12 ENCOUNTER — Ambulatory Visit: Payer: Self-pay | Admitting: Family

## 2024-01-12 MED ORDER — METRONIDAZOLE 500 MG PO TABS: 500.0000 mg | ORAL_TABLET | Freq: Two times a day (BID) | ORAL | 0 refills | Status: AC

## 2024-01-18 ENCOUNTER — Telehealth: Admitting: Physician Assistant

## 2024-01-18 ENCOUNTER — Encounter: Payer: Self-pay | Admitting: Family

## 2024-01-18 DIAGNOSIS — B001 Herpesviral vesicular dermatitis: Secondary | ICD-10-CM | POA: Diagnosis not present

## 2024-01-18 MED ORDER — VALACYCLOVIR HCL 1 G PO TABS: 2000.0000 mg | ORAL_TABLET | Freq: Two times a day (BID) | ORAL | 0 refills | Status: AC
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
## 2024-01-18 NOTE — Patient Instructions (Signed)
 [name] [name], thank you for joining [name] Velma Lunger, PA-C for today's virtual visit.  While this provider is not your primary care provider (PCP), if your PCP is located in our provider database this encounter information will be shared with them immediately following your visit.   A [facility] MyChart account gives you access to today's visit and all your visits, tests, and labs performed at [facility]  click here if you don't have a [facility] MyChart account or go to [email or web address]  Consent: (Patient) [name] [name] provided verbal consent for this virtual visit at the beginning of the encounter.  Current Medications:  Current Outpatient Medications:    fluconazole  (DIFLUCAN ) 150 MG tablet, Take 1 tablet (150 mg total) by mouth daily. (Patient not taking: Reported on 01/06/2024), Disp: 5 tablet, Rfl: 0   Fluocinolone  Acetonide Scalp 0.01 % OIL, Apply 1 Application topically daily. (Patient not taking: Reported on 01/06/2024), Disp: 118 mL, Rfl: 1   ibuprofen  (ADVIL ) 600 MG tablet, Take 1 tablet (600 mg total) by mouth every 6 (six) hours as needed. (Patient not taking: Reported on 01/06/2024), Disp: 30 tablet, Rfl: 0   metFORMIN  (GLUCOPHAGE ) 500 MG tablet, Take 1 tablet (500 mg total) by mouth 2 (two) times daily with a meal., Disp: 180 tablet, Rfl: 1   metroNIDAZOLE  (FLAGYL ) 500 MG tablet, Take 1 tablet (500 mg total) by mouth 2 (two) times daily for 9 days., Disp: 18 tablet, Rfl: 0   omeprazole  (PRILOSEC) 40 MG capsule, TAKE 1 CAPSULE (40 MG TOTAL) BY MOUTH DAILY. (Patient not taking: Reported on 01/06/2024), Disp: 90 capsule, Rfl: 1   tirzepatide  (ZEPBOUND ) 5 MG/0.5ML Pen, Inject 5 mg into the skin once a week., Disp: 2 mL, Rfl: 2   triamcinolone lotion (KENALOG) 0.1 %, Apply topically 2 (two) times daily. (Patient not taking: Reported on 01/06/2024), Disp: 60 mL, Rfl: 0   Vitamin D , Ergocalciferol , (DRISDOL ) 1.25 MG (50000 UNIT) CAPS capsule, Take 1 capsule  (50,000 Units total) by mouth every 7 (seven) days. (Patient not taking: Reported on 01/06/2024), Disp: 12 capsule, Rfl: 1   VYVANSE  40 MG capsule, Take 1 capsule (40 mg total) by mouth daily., Disp: 30 capsule, Rfl: 0   Medications ordered in this encounter:  No orders of the defined types were placed in this encounter.    *If you need refills on other medications prior to your next appointment, please contact your pharmacy*  Follow-Up: Call back or seek an in-person evaluation if the symptoms worsen or if the condition fails to improve as anticipated.   Virtual Care [phone]  Other Instructions Cold Sore  A cold sore, also called a fever blister, is a small, fluid-filled sore that forms inside of the mouth or on the lips, gums, nose, chin, or cheeks. Cold sores can spread to other parts of the body, such as the eyes, fingers, or genitals. Cold sores can spread from person to person (are contagious) until the sores crust over completely. Most cold sores go away within 2 weeks. What are the causes? Cold sores are caused by a virus (herpes simplex virus type 1, HSV-1). The virus can spread from person to person through close contact, such as through: Kissing. Touching the affected area. Sharing personal items such as lip balm, razors, a drinking glass, or eating utensils. What increases the risk? Being tired, stressed, or sick. Having your period (menstruating). Being pregnant. Taking certain medicines. Being out in cold weather or getting too much sun. What  are the signs or symptoms? Symptoms of a cold sore go through different stages: Tingling, itching, or burning is felt 1-2 days before the cold sore appears. Fluid-filled blisters appear on the lips, inside the mouth, on the nose, or on the cheeks. The blisters start to ooze clear fluid. The blisters dry up, and a yellow crust appears in their place. The crust falls off. In some cases, other symptoms can develop  along with cold sores. These can include: Fever. Sore throat. Headache. Muscle aches. Swollen neck glands. How is this treated? There is no cure for cold sores or the virus that causes them. There is also no vaccine to prevent the virus. Most cold sores go away on their own without treatment within 2 weeks. Your doctor may prescribe medicines to: Help with pain. Keep the virus from growing. Help you heal faster. Medicines may be in the form of creams, gels, pills, or a shot. Follow these instructions at home: Medicines Take or apply over-the-counter and prescription medicines only as told by your doctor. Use a cotton-tip swab to apply creams or gels to your sores. Ask your doctor if you can take lysine supplements. These may help with healing. Sore care  Do not touch the sores or pick the scabs. Wash your hands often with soap and water for at least 20 seconds. Do not touch your eyes without washing your hands first. Keep the sores clean and dry. If told, put ice on the sores. To do this: Put ice in a plastic bag. Place a towel between your skin and the bag. Leave the ice on for 20 minutes, 2-3 times a day. Take off the ice if your skin turns bright red. This is very important. If you cannot feel pain, heat, or cold, you have a greater risk of damage to the area. Eating and drinking Eat a soft, bland diet. Avoid eating hot, cold, or salty foods. These can hurt your mouth. Use a straw if it hurts to drink out of a glass. Eat foods that have a lot of lysine in them. These include meat, fish, and dairy products. Avoid sugary foods, chocolates, nuts, and grains. These foods have a high amount of a substance (arginine) that can cause the virus to grow. Lifestyle Do not kiss, have oral sex, or share personal items until your sores heal. Stress, poor sleep, and being out in the sun can trigger a cold sore. Make sure you: Do activities that help you relax, such as deep breathing exercises  or meditation. Get enough sleep. Put sunscreen on your lips before you go out in the sun. Contact a doctor if: You have symptoms for more than 2 weeks. You have pus coming from the sores. You have redness that is spreading. You have pain or irritation in your eye. You get sores on your genitals. Your sores do not heal within 2 weeks. You get cold sores often. Get help right away if: You have a fever and your symptoms suddenly get worse. You have a headache and confusion. You have tiredness (fatigue). You do not want to eat as much as normal (loss of appetite). You have a stiff neck or are sensitive to light. Summary A cold sore is a small, fluid-filled sore that forms inside of the mouth or on the lips, gums, nose, chin, or cheeks. Cold sores can spread from person to person (are contagious) until the sores crust over completely. Most cold sores go away within 2 weeks. Wash your hands often.  Do not touch your eyes without washing your hands first. Do not kiss, have oral sex, or share personal items until your sores heal. Contact a doctor if your sores do not heal within 2 weeks. This information is not intended to replace advice given to you by your health care provider. Make sure you discuss any questions you have with your health care provider. Document Revised: 03/19/2021 Document Reviewed: 03/19/2021 Elsevier Patient Education  2024 Elsevier Inc.   If you have been instructed to have an in-person evaluation today at a local Urgent Care facility, please use the link below. It will take you to a list of all of our available [facility], including address, phone number and hours of operation. Please do not delay care.  [facility]  If you or a family member do not have a primary care provider, use the link below to schedule a visit and establish care. When you choose a [facility] primary care physician or advanced practice provider, you gain a long-term  partner in health. Find a Primary Care Provider  Learn more about [facility]'s in-office and virtual care options: [facility] - Get Care Now
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
## 2024-01-20 NOTE — Assessment & Plan Note (Signed)
 Checking labs today.  Will continue supplements as needed.   - Vitamin D  - Vitamin B12 - TSH

## 2024-01-27 ENCOUNTER — Other Ambulatory Visit: Payer: Self-pay

## 2024-01-31 ENCOUNTER — Encounter: Payer: Self-pay | Admitting: Family
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
## 2024-01-31 NOTE — Assessment & Plan Note (Signed)
 Patient stable.  Well controlled with current therapy.   Continue current meds.

## 2024-03-09 ENCOUNTER — Encounter: Payer: Self-pay | Admitting: Family

## 2024-03-09 ENCOUNTER — Ambulatory Visit (INDEPENDENT_AMBULATORY_CARE_PROVIDER_SITE_OTHER): Admitting: Family

## 2024-03-09 VITALS — BP 126/84 | HR 79 | Ht 66.0 in | Wt 240.8 lb

## 2024-03-09 DIAGNOSIS — T7411XA Adult physical abuse, confirmed, initial encounter: Secondary | ICD-10-CM | POA: Diagnosis not present

## 2024-03-09 DIAGNOSIS — F411 Generalized anxiety disorder: Secondary | ICD-10-CM | POA: Diagnosis not present

## 2024-03-09 DIAGNOSIS — F4320 Adjustment disorder, unspecified: Secondary | ICD-10-CM | POA: Insufficient documentation

## 2024-03-09 DIAGNOSIS — Z0130 Encounter for examination of blood pressure without abnormal findings: Secondary | ICD-10-CM

## 2024-03-09 DIAGNOSIS — F4323 Adjustment disorder with mixed anxiety and depressed mood: Secondary | ICD-10-CM | POA: Diagnosis not present

## 2024-03-09 DIAGNOSIS — Z131 Encounter for screening for diabetes mellitus: Secondary | ICD-10-CM

## 2024-03-09 DIAGNOSIS — F900 Attention-deficit hyperactivity disorder, predominantly inattentive type: Secondary | ICD-10-CM

## 2024-03-09 MED ORDER — BUPROPION HCL ER (XL) 300 MG PO TB24: 300.0000 mg | ORAL_TABLET | Freq: Every day | ORAL | 1 refills | Status: DC

## 2024-03-09 MED ORDER — ZEPBOUND 7.5 MG/0.5ML ~~LOC~~ SOAJ: 7.5000 mg | SUBCUTANEOUS | 0 refills | Status: DC

## 2024-03-09 MED ORDER — TIRZEPATIDE-WEIGHT MANAGEMENT 10 MG/0.5ML ~~LOC~~ SOAJ: 10.0000 mg | SUBCUTANEOUS | 2 refills | Status: DC
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
## 2024-03-16 NOTE — Assessment & Plan Note (Signed)
 Patient not currently taking medications.  Will reassess at follow up.

## 2024-03-16 NOTE — Assessment & Plan Note (Signed)
 Suggested patient look into finding herself a therapist who is on her insurance. I think it would benefit her, as she has a lot going on and she needs someone with whom she can discuss these issues and work on a plan.

## 2024-03-30 ENCOUNTER — Encounter: Payer: Self-pay | Admitting: Family

## 2024-03-30 ENCOUNTER — Ambulatory Visit: Admitting: Family

## 2024-03-30 VITALS — BP 112/84 | HR 78 | Ht 66.0 in | Wt 237.6 lb

## 2024-03-30 DIAGNOSIS — E782 Mixed hyperlipidemia: Secondary | ICD-10-CM

## 2024-03-30 DIAGNOSIS — R7303 Prediabetes: Secondary | ICD-10-CM

## 2024-03-30 DIAGNOSIS — R6882 Decreased libido: Secondary | ICD-10-CM

## 2024-03-30 DIAGNOSIS — E559 Vitamin D deficiency, unspecified: Secondary | ICD-10-CM | POA: Diagnosis not present

## 2024-03-30 DIAGNOSIS — R5383 Other fatigue: Secondary | ICD-10-CM | POA: Diagnosis not present

## 2024-03-30 DIAGNOSIS — E039 Hypothyroidism, unspecified: Secondary | ICD-10-CM

## 2024-03-30 DIAGNOSIS — G4733 Obstructive sleep apnea (adult) (pediatric): Secondary | ICD-10-CM | POA: Diagnosis not present

## 2024-03-30 DIAGNOSIS — Z30432 Encounter for removal of intrauterine contraceptive device: Secondary | ICD-10-CM

## 2024-03-30 DIAGNOSIS — E538 Deficiency of other specified B group vitamins: Secondary | ICD-10-CM

## 2024-03-30 DIAGNOSIS — Z0130 Encounter for examination of blood pressure without abnormal findings: Secondary | ICD-10-CM

## 2024-04-02 ENCOUNTER — Encounter: Payer: Self-pay | Admitting: Family

## 2024-04-02 NOTE — Assessment & Plan Note (Signed)
 Checking labs today.  Will continue supplements as needed.   - Vitamin D  - Vitamin B12 - TSH
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
## 2024-04-05 LAB — LIPID PANEL
Chol/HDL Ratio: 3.7 ratio (ref 0.0–4.4)
Cholesterol, Total: 148 mg/dL (ref 100–199)
HDL: 40 mg/dL (ref >39)
LDL Chol Calc (NIH): 94 mg/dL (ref 0–99)
Triglycerides: 68 mg/dL (ref 0–149)
VLDL Cholesterol Cal: 14 mg/dL (ref 5–40)

## 2024-04-05 LAB — CBC WITH DIFFERENTIAL/PLATELET
Basophils Absolute: 0 x10E3/uL (ref 0.0–0.2)
Basos: 1 %
EOS (ABSOLUTE): 0.2 x10E3/uL (ref 0.0–0.4)
Eos: 3 %
Hematocrit: 43.2 % (ref 34.0–46.6)
Hemoglobin: 14.2 g/dL (ref 11.1–15.9)
Immature Grans (Abs): 0 x10E3/uL (ref 0.0–0.1)
Immature Granulocytes: 0 %
Lymphocytes Absolute: 2 x10E3/uL (ref 0.7–3.1)
Lymphs: 34 %
MCH: 31.1 pg (ref 26.6–33.0)
MCHC: 32.9 g/dL (ref 31.5–35.7)
MCV: 95 fL (ref 79–97)
Monocytes Absolute: 0.5 x10E3/uL (ref 0.1–0.9)
Monocytes: 9 %
Neutrophils Absolute: 3.2 x10E3/uL (ref 1.4–7.0)
Neutrophils: 53 %
Platelets: 218 x10E3/uL (ref 150–450)
RBC: 4.57 x10E6/uL (ref 3.77–5.28)
RDW: 12.3 % (ref 11.7–15.4)
WBC: 6 x10E3/uL (ref 3.4–10.8)

## 2024-04-05 LAB — FSH+PROG+E2+SHBG
Estradiol: 63.8 pg/mL
FSH: 7 m[IU]/mL
Progesterone: 0.4 ng/mL
Sex Hormone Binding: 64 nmol/L (ref 24.6–122.0)

## 2024-04-05 LAB — CMP14+EGFR
ALT: 17 IU/L (ref 0–32)
AST: 15 IU/L (ref 0–40)
Albumin: 4.3 g/dL (ref 3.9–4.9)
Alkaline Phosphatase: 103 IU/L (ref 41–116)
BUN/Creatinine Ratio: 11 (ref 9–23)
BUN: 8 mg/dL (ref 6–20)
Bilirubin Total: 0.7 mg/dL (ref 0.0–1.2)
CO2: 23 mmol/L (ref 20–29)
Calcium: 9.1 mg/dL (ref 8.7–10.2)
Chloride: 104 mmol/L (ref 96–106)
Creatinine, Ser: 0.76 mg/dL (ref 0.57–1.00)
Globulin, Total: 2.6 g/dL (ref 1.5–4.5)
Glucose: 73 mg/dL (ref 70–99)
Potassium: 3.9 mmol/L (ref 3.5–5.2)
Sodium: 139 mmol/L (ref 134–144)
Total Protein: 6.9 g/dL (ref 6.0–8.5)
eGFR: 102 mL/min/1.73 (ref >59)

## 2024-04-05 LAB — TSH+T4F+T3FREE
Free T4: 1.25 ng/dL (ref 0.82–1.77)
T3, Free: 3.7 pg/mL (ref 2.0–4.4)
TSH: 1.83 u[IU]/mL (ref 0.450–4.500)

## 2024-04-05 LAB — VITAMIN D 25 HYDROXY (VIT D DEFICIENCY, FRACTURES): Vit D, 25-Hydroxy: 32.1 ng/mL (ref 30.0–100.0)

## 2024-04-05 LAB — HEMOGLOBIN A1C
Est. average glucose Bld gHb Est-mCnc: 100 mg/dL
Hgb A1c MFr Bld: 5.1 % (ref 4.8–5.6)

## 2024-04-05 LAB — VITAMIN B12: Vitamin B-12: 266 pg/mL (ref 232–1245)

## 2024-04-05 LAB — IRON,TIBC AND FERRITIN PANEL
Ferritin: 171 ng/mL — ABNORMAL HIGH (ref 15–150)
Iron Saturation: 65 % — ABNORMAL HIGH (ref 15–55)
Iron: 183 ug/dL — ABNORMAL HIGH (ref 27–159)
Total Iron Binding Capacity: 281 ug/dL (ref 250–450)
UIBC: 98 ug/dL — ABNORMAL LOW (ref 131–425)

## 2024-04-06 ENCOUNTER — Encounter: Payer: Self-pay | Admitting: Family

## 2024-04-11 ENCOUNTER — Other Ambulatory Visit

## 2024-04-11 ENCOUNTER — Other Ambulatory Visit: Payer: Self-pay

## 2024-04-11 ENCOUNTER — Encounter: Payer: Self-pay | Admitting: Family

## 2024-04-11 DIAGNOSIS — R5383 Other fatigue: Secondary | ICD-10-CM

## 2024-04-12 LAB — IRON,TIBC AND FERRITIN PANEL
Ferritin: 125 ng/mL (ref 15–150)
Iron Saturation: 32 % (ref 15–55)
Iron: 87 ug/dL (ref 27–159)
Total Iron Binding Capacity: 276 ug/dL (ref 250–450)
UIBC: 189 ug/dL (ref 131–425)

## 2024-04-18 NOTE — Telephone Encounter (Signed)
 Already addressed

## 2024-04-21 ENCOUNTER — Ambulatory Visit: Payer: Self-pay

## 2024-05-01 ENCOUNTER — Ambulatory Visit: Admitting: Family

## 2024-05-01 ENCOUNTER — Encounter: Payer: Self-pay | Admitting: Family

## 2024-05-01 VITALS — BP 104/78 | HR 96 | Ht 66.0 in | Wt 242.8 lb

## 2024-05-01 DIAGNOSIS — J069 Acute upper respiratory infection, unspecified: Secondary | ICD-10-CM

## 2024-05-01 DIAGNOSIS — E782 Mixed hyperlipidemia: Secondary | ICD-10-CM | POA: Insufficient documentation

## 2024-05-01 DIAGNOSIS — R7303 Prediabetes: Secondary | ICD-10-CM | POA: Insufficient documentation

## 2024-05-01 DIAGNOSIS — E66812 Obesity, class 2: Secondary | ICD-10-CM | POA: Insufficient documentation

## 2024-05-01 DIAGNOSIS — Z6839 Body mass index (BMI) 39.0-39.9, adult: Secondary | ICD-10-CM

## 2024-05-01 DIAGNOSIS — H6502 Acute serous otitis media, left ear: Secondary | ICD-10-CM | POA: Diagnosis not present

## 2024-05-01 DIAGNOSIS — R5383 Other fatigue: Secondary | ICD-10-CM | POA: Insufficient documentation

## 2024-05-01 DIAGNOSIS — E6609 Other obesity due to excess calories: Secondary | ICD-10-CM

## 2024-05-01 DIAGNOSIS — Z0130 Encounter for examination of blood pressure without abnormal findings: Secondary | ICD-10-CM

## 2024-05-01 LAB — POCT RAPID STREP A (OFFICE): Rapid Strep A Screen: NEGATIVE

## 2024-05-01 LAB — POCT XPERT XPRESS SARS COVID-2/FLU/RSV
FLU A: NEGATIVE
FLU B: NEGATIVE
RSV RNA, PCR: NEGATIVE
SARS Coronavirus 2: NEGATIVE

## 2024-05-01 MED ORDER — CIPROFLOXACIN HCL 500 MG PO TABS: 500.0000 mg | ORAL_TABLET | Freq: Two times a day (BID) | ORAL | 0 refills | Status: AC
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
## 2024-05-01 NOTE — Assessment & Plan Note (Addendum)
-   Continue healthy diet and exercise as tolerated. - Check labs when she returns.

## 2024-05-01 NOTE — Assessment & Plan Note (Addendum)
-   Start Cipro twice day for 7 days. - Reinforced staying hydrated and resting.

## 2024-05-01 NOTE — Assessment & Plan Note (Addendum)
-   check labs when she returns.

## 2024-05-11 NOTE — Patient Instructions (Addendum)
 Preventive Care 8-39 Years Old, Female  Preventive care refers to lifestyle choices and visits with your health care provider that can promote health and wellness. Preventive care visits are also called wellness exams. What can I expect for my preventive care visit? Counseling During your preventive care visit, your health care provider may ask about your: Medical history, including: Past medical problems. Family medical history. Pregnancy history. Current health, including: Menstrual cycle. Method of birth control. Emotional well-being. Home life and relationship well-being. Sexual activity and sexual health. Lifestyle, including: Alcohol, nicotine or tobacco, and drug use. Access to firearms. Diet, exercise, and sleep habits. Work and work Astronomer. Sunscreen use. Safety issues such as seatbelt and bike helmet use. Physical exam Your health care provider may check your: Height and weight. These may be used to calculate your BMI (body mass index). BMI is a measurement that tells if you are at a healthy weight. Waist circumference. This measures the distance around your waistline. This measurement also tells if you are at a healthy weight and may help predict your risk of certain diseases, such as type 2 diabetes and high blood pressure. Heart rate and blood pressure. Body temperature. Skin for abnormal spots. What immunizations do I need?  Vaccines are usually given at various ages, according to a schedule. Your health care provider will recommend vaccines for you based on your age, medical history, and lifestyle or other factors, such as travel or where you work. What tests do I need? Screening Your health care provider may recommend screening tests for certain conditions. This may include: Pelvic exam and Pap test. Lipid and cholesterol levels. Diabetes screening. This is done by checking your blood sugar (glucose) after you have not eaten for a while  (fasting). Hepatitis B test. Hepatitis C test. HIV (human immunodeficiency virus) test. STI (sexually transmitted infection) testing, if you are at risk. BRCA-related cancer screening. This may be done if you have a family history of breast, ovarian, tubal, or peritoneal cancers. Talk with your health care provider about your test results, treatment options, and if necessary, the need for more tests. Follow these instructions at home: Eating and drinking  Eat a healthy diet that includes fresh fruits and vegetables, whole grains, lean protein, and low-fat dairy products. Take vitamin and mineral supplements as recommended by your health care provider. Do not drink alcohol if: Your health care provider tells you not to drink. You are pregnant, may be pregnant, or are planning to become pregnant. If you drink alcohol: Limit how much you have to 0-1 drink a day. Know how much alcohol is in your drink. In the [place] one drink equals one 12 oz bottle of beer (355 mL), one 5 oz glass of wine (148 mL), or one 1 oz glass of hard liquor (44 mL). Lifestyle Brush your teeth every morning and night with fluoride toothpaste. Floss one time each day. Exercise for at least 30 minutes 5 or more days each week. Do not use any products that contain nicotine or tobacco. These products include cigarettes, chewing tobacco, and vaping devices, such as e-cigarettes. If you need help quitting, ask your health care provider. Do not use drugs. If you are sexually active, practice safe sex. Use a condom or other form of protection to prevent STIs. If you do not wish to become pregnant, use a form of birth control. If you plan to become pregnant, see your health care provider for a prepregnancy visit. Find healthy ways to manage stress, such as:  Meditation, yoga, or listening to music. Journaling. Talking to a trusted person. Spending time with friends and family. Minimize exposure to UV radiation to reduce your  risk of skin cancer. Safety Always wear your seat belt while driving or riding in a vehicle. Do not drive: If you have been drinking alcohol. Do not ride with someone who has been drinking. If you have been using any mind-altering substances or drugs. While texting. When you are tired or distracted. Wear a helmet and other protective equipment during sports activities. If you have firearms in your house, make sure you follow all gun safety procedures. Seek help if you have been physically or sexually abused. What's next? Go to your health care provider once a year for an annual wellness visit. Ask your health care provider how often you should have your eyes and teeth checked. Stay up to date on all vaccines. This information is not intended to replace advice given to you by your health care provider. Make sure you discuss any questions you have with your health care provider. Document Revised: 12/04/2020 Document Reviewed: 12/04/2020 Elsevier Patient Education  2024 [employer]  How to Do a Breast Self-Exam Doing breast self-exams can help you stay healthy. They're one way to know what's normal for your breasts. They can help you catch a problem while it's still small and can be treated. You need to: Check your breasts often. Tell your doctor about any changes. You should do breast self-exams even if you have breast implants. What you need: A mirror. A well-lit room. A pillow or other soft object. How to do a breast self-exam Look for changes  Take off all the clothes above your waist. Stand in front of a mirror in a room with good lighting. Put your hands down at your sides. Compare your breasts in the mirror. Look for difference between them, such as: Differences in shape. Differences in size. Wrinkles, dips, and bumps in one breast and not the other. Look at each breast for skin changes, such as: Redness. Scaly spots. Spots where your skin is thicker. Dimpling. Open  sores. Look for changes in your nipples, such as: Fluid coming out of a nipple. Fluid around a nipple. Bleeding. Dimpling. Redness. A nipple that looks pushed in or that has changed position. Feel for changes Lie on your back. Feel each breast. To do this: Pick a breast to feel. Place a pillow under the shoulder closest to that breast. Put the arm closest to that breast behind your head. Feel the breast using the hand of your other arm. Use the pads of your three middle fingers to make small circles starting near the nipple. Use light, medium, and firm pressure. Keep making circles, moving down over the breast. Stop when you feel your ribs. Start making circles with your fingers again, this time going up until you reach your collarbone. Then, make circles out across your breast and into your armpit area. Squeeze your nipple. Check for fluid and lumps. Do these steps again to check your other breast. Sit or stand in the tub or shower. With soapy water on your skin, feel each breast the same way you did when you were lying down. Write down what you find Writing down what you find can help you keep track of what you want to tell your doctor. Write down: What's normal for each breast. Any changes you find. Write down: The kind of change. If your breast feels tender or painful. Any lump you  find. Write down its size and where it is. When you last had your period. General tips If you're breastfeeding, the best time to check your breasts is after you feed your baby or after you use a breast pump. If you get a period, the best time to check your breasts is 5-7 days after your period ends. With time, you'll get more used to doing the self-exam. You'll also start to know if there are changes in your breasts. Contact a doctor if: You see a change in the shape or size of your breasts or nipples. You see a change in the skin of your breast or nipples. You have fluid coming from your nipples  that isn't normal. You find a new lump or thick area. You have breast pain. You have any concerns about your breast health. This information is not intended to replace advice given to you by your health care provider. Make sure you discuss any questions you have with your health care provider. Document Revised: 08/18/2023 Document Reviewed: 08/18/2023 Elsevier Patient Education  2025 [employer].
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
## 2024-05-12 ENCOUNTER — Ambulatory Visit: Admitting: Certified Nurse Midwife

## 2024-05-12 ENCOUNTER — Encounter: Payer: Self-pay | Admitting: Family

## 2024-05-12 ENCOUNTER — Encounter: Payer: Self-pay | Admitting: Certified Nurse Midwife

## 2024-05-12 ENCOUNTER — Encounter (INDEPENDENT_AMBULATORY_CARE_PROVIDER_SITE_OTHER): Payer: Self-pay

## 2024-05-12 ENCOUNTER — Ambulatory Visit: Admitting: Family

## 2024-05-12 VITALS — BP 107/77 | HR 92 | Resp 16 | Ht 66.0 in | Wt 243.5 lb

## 2024-05-12 DIAGNOSIS — Z23 Encounter for immunization: Secondary | ICD-10-CM | POA: Diagnosis not present

## 2024-05-12 DIAGNOSIS — Z803 Family history of malignant neoplasm of breast: Secondary | ICD-10-CM

## 2024-05-12 DIAGNOSIS — Z113 Encounter for screening for infections with a predominantly sexual mode of transmission: Secondary | ICD-10-CM

## 2024-05-12 DIAGNOSIS — Z01419 Encounter for gynecological examination (general) (routine) without abnormal findings: Secondary | ICD-10-CM

## 2024-05-12 DIAGNOSIS — Z1231 Encounter for screening mammogram for malignant neoplasm of breast: Secondary | ICD-10-CM

## 2024-05-12 MED ORDER — BUPROPION HCL ER (XL) 300 MG PO TB24: 300.0000 mg | ORAL_TABLET | Freq: Every day | ORAL | 1 refills | Status: AC

## 2024-05-22 ENCOUNTER — Other Ambulatory Visit: Payer: Self-pay | Admitting: Family

## 2024-06-01 ENCOUNTER — Telehealth: Payer: Self-pay

## 2024-06-01 ENCOUNTER — Other Ambulatory Visit: Payer: Self-pay

## 2024-06-01 NOTE — Telephone Encounter (Signed)
 Pt is asking for an increase on her Metformin  dose however I do not see it on her chart. Pt states she is taking 300 mg.

## 2024-06-02 ENCOUNTER — Encounter: Payer: Self-pay | Admitting: Family

## 2024-06-05 MED ORDER — VYVANSE 40 MG PO CAPS: 40.0000 mg | ORAL_CAPSULE | Freq: Every day | ORAL | 0 refills | Status: DC

## 2024-06-06 ENCOUNTER — Other Ambulatory Visit: Payer: Self-pay

## 2024-06-06 MED ORDER — METFORMIN HCL 500 MG PO TABS: 500.0000 mg | ORAL_TABLET | Freq: Two times a day (BID) | ORAL | 3 refills | Status: AC

## 2024-06-08 ENCOUNTER — Other Ambulatory Visit: Payer: Self-pay

## 2024-06-08 NOTE — Telephone Encounter (Signed)
 Pended the Flibanserin  to [facility] for fill

## 2024-06-08 NOTE — Telephone Encounter (Signed)
 See other message

## 2024-06-09 ENCOUNTER — Other Ambulatory Visit: Payer: Self-pay | Admitting: Family

## 2024-06-09 MED ORDER — FLIBANSERIN 100 MG PO TABS: 100.0000 mg | ORAL_TABLET | Freq: Every day | ORAL | 2 refills | Status: AC

## 2024-07-05 ENCOUNTER — Other Ambulatory Visit: Payer: Self-pay | Admitting: Medical Genetics

## 2024-07-12 ENCOUNTER — Ambulatory Visit: Admitting: Family

## 2024-07-12 VITALS — BP 108/74 | HR 95 | Ht 66.0 in | Wt 248.4 lb

## 2024-07-12 DIAGNOSIS — R5383 Other fatigue: Secondary | ICD-10-CM

## 2024-07-12 DIAGNOSIS — J3089 Other allergic rhinitis: Secondary | ICD-10-CM | POA: Insufficient documentation

## 2024-07-12 DIAGNOSIS — R7303 Prediabetes: Secondary | ICD-10-CM

## 2024-07-12 DIAGNOSIS — F411 Generalized anxiety disorder: Secondary | ICD-10-CM | POA: Diagnosis not present

## 2024-07-12 DIAGNOSIS — Z6841 Body Mass Index (BMI) 40.0 and over, adult: Secondary | ICD-10-CM | POA: Diagnosis not present

## 2024-07-12 DIAGNOSIS — E782 Mixed hyperlipidemia: Secondary | ICD-10-CM | POA: Diagnosis not present

## 2024-07-12 DIAGNOSIS — F900 Attention-deficit hyperactivity disorder, predominantly inattentive type: Secondary | ICD-10-CM | POA: Diagnosis not present

## 2024-07-12 DIAGNOSIS — F331 Major depressive disorder, recurrent, moderate: Secondary | ICD-10-CM | POA: Diagnosis not present

## 2024-07-12 DIAGNOSIS — E538 Deficiency of other specified B group vitamins: Secondary | ICD-10-CM

## 2024-07-12 DIAGNOSIS — E559 Vitamin D deficiency, unspecified: Secondary | ICD-10-CM | POA: Diagnosis not present

## 2024-07-12 DIAGNOSIS — R6882 Decreased libido: Secondary | ICD-10-CM | POA: Insufficient documentation

## 2024-07-12 DIAGNOSIS — E039 Hypothyroidism, unspecified: Secondary | ICD-10-CM | POA: Diagnosis not present

## 2024-07-12 MED ORDER — AZELASTINE HCL 0.1 % NA SOLN: 2.0000 | Freq: Two times a day (BID) | NASAL | 12 refills | Status: AC

## 2024-07-12 MED ORDER — FLUOXETINE HCL 10 MG PO CAPS: 10.0000 mg | ORAL_CAPSULE | Freq: Every day | ORAL | 2 refills | Status: AC

## 2024-07-12 MED ORDER — TIRZEPATIDE-WEIGHT MANAGEMENT 5 MG/0.5ML ~~LOC~~ SOAJ: 5.0000 mg | SUBCUTANEOUS | 0 refills | Status: AC

## 2024-07-12 NOTE — Assessment & Plan Note (Signed)
-   Reinforced healthy diet and exercise as tolerated to promote weight loss. - Start Zepbound  once weekly injection as prescribed. PA completed as needed for insurance. - Continue medications as prescribed. - Check labs in 3 months.

## 2024-07-12 NOTE — Assessment & Plan Note (Signed)
-  Stable; Continue current supplementation. - Check labs in 3 months.

## 2024-07-12 NOTE — Assessment & Plan Note (Signed)
-   Start Prozac  once daily.  - Continue other medications as prescribed. FU if symptoms worsen or she has thoughts of suicide ideation, self harm or harm of others. - Discussed healthy coping mechanisms and stress reduction.
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
## 2024-07-12 NOTE — Assessment & Plan Note (Signed)
-   Start Astelin  nasal spray twice daily.

## 2024-07-13 ENCOUNTER — Encounter: Payer: Self-pay | Admitting: Family

## 2024-07-13 MED ORDER — VYVANSE 40 MG PO CAPS: 40.0000 mg | ORAL_CAPSULE | Freq: Every day | ORAL | 0 refills | Status: AC

## 2024-07-16 ENCOUNTER — Encounter: Payer: Self-pay | Admitting: Family

## 2024-07-28 NOTE — Telephone Encounter (Signed)
 Rx sent.

## 2024-08-11 ENCOUNTER — Ambulatory Visit: Admitting: Family
